# Patient Record
Sex: Male | Born: 1960 | Race: Black or African American | Hispanic: No | Marital: Single | Smoking: Current every day smoker
Health system: Southern US, Community
[De-identification: ages and names within clinical notes are randomized; demographics above are authoritative.]

## PROBLEM LIST (undated history)

## (undated) DIAGNOSIS — I10 Essential (primary) hypertension: Secondary | ICD-10-CM

## (undated) DIAGNOSIS — J449 Chronic obstructive pulmonary disease, unspecified: Secondary | ICD-10-CM

## (undated) DIAGNOSIS — C3490 Malignant neoplasm of unspecified part of unspecified bronchus or lung: Secondary | ICD-10-CM

## (undated) DIAGNOSIS — J45909 Unspecified asthma, uncomplicated: Secondary | ICD-10-CM

## (undated) HISTORY — DX: Malignant neoplasm of unspecified part of unspecified bronchus or lung: C34.90

## (undated) HISTORY — DX: Chronic obstructive pulmonary disease, unspecified: J44.9

## (undated) HISTORY — DX: Essential (primary) hypertension: I10

---

## 2002-12-19 ENCOUNTER — Other Ambulatory Visit: Payer: Self-pay

## 2003-01-08 ENCOUNTER — Other Ambulatory Visit: Payer: Self-pay

## 2004-01-06 ENCOUNTER — Emergency Department: Payer: Self-pay | Admitting: Emergency Medicine

## 2004-03-07 ENCOUNTER — Emergency Department: Payer: Self-pay | Admitting: Emergency Medicine

## 2007-12-12 ENCOUNTER — Emergency Department: Payer: Self-pay | Admitting: Emergency Medicine

## 2007-12-21 ENCOUNTER — Emergency Department: Payer: Self-pay | Admitting: Emergency Medicine

## 2008-01-10 ENCOUNTER — Emergency Department: Payer: Self-pay | Admitting: Internal Medicine

## 2008-12-15 ENCOUNTER — Inpatient Hospital Stay: Payer: Self-pay | Admitting: Internal Medicine

## 2008-12-31 ENCOUNTER — Inpatient Hospital Stay: Payer: Self-pay | Admitting: Internal Medicine

## 2009-02-28 ENCOUNTER — Inpatient Hospital Stay: Payer: Self-pay | Admitting: Internal Medicine

## 2010-01-12 ENCOUNTER — Inpatient Hospital Stay: Payer: Self-pay | Admitting: Internal Medicine

## 2010-03-01 ENCOUNTER — Inpatient Hospital Stay: Payer: Self-pay | Admitting: Internal Medicine

## 2010-06-24 IMAGING — CR DG CHEST 2V
1 series · 2 of 2 positions shown · non-contrast
Comparison: none

REASON FOR EXAM: dyspnea/wheezing/Rales
COMMENTS:   May transport without cardiac monitor

[Series 1: view not recorded · 0.17mm/px · 2 of 2 slices shown]
[im 1/2]
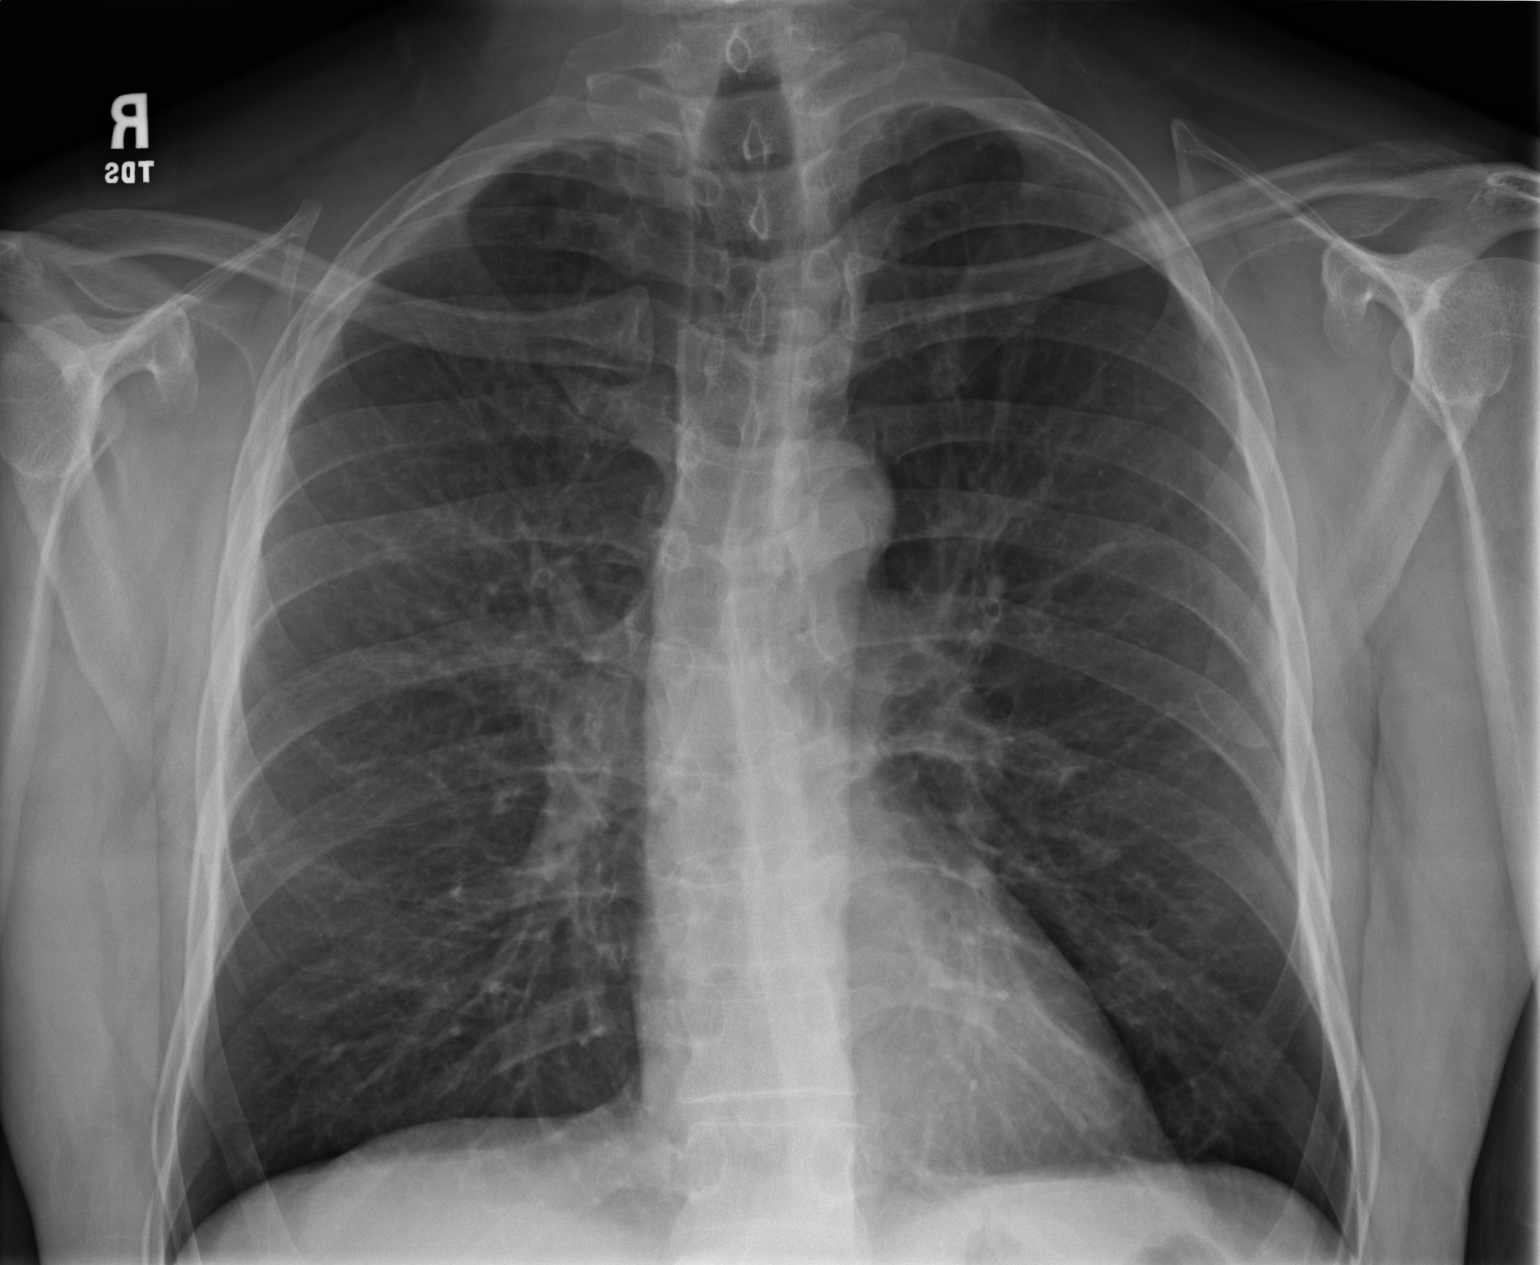
[im 2/2]
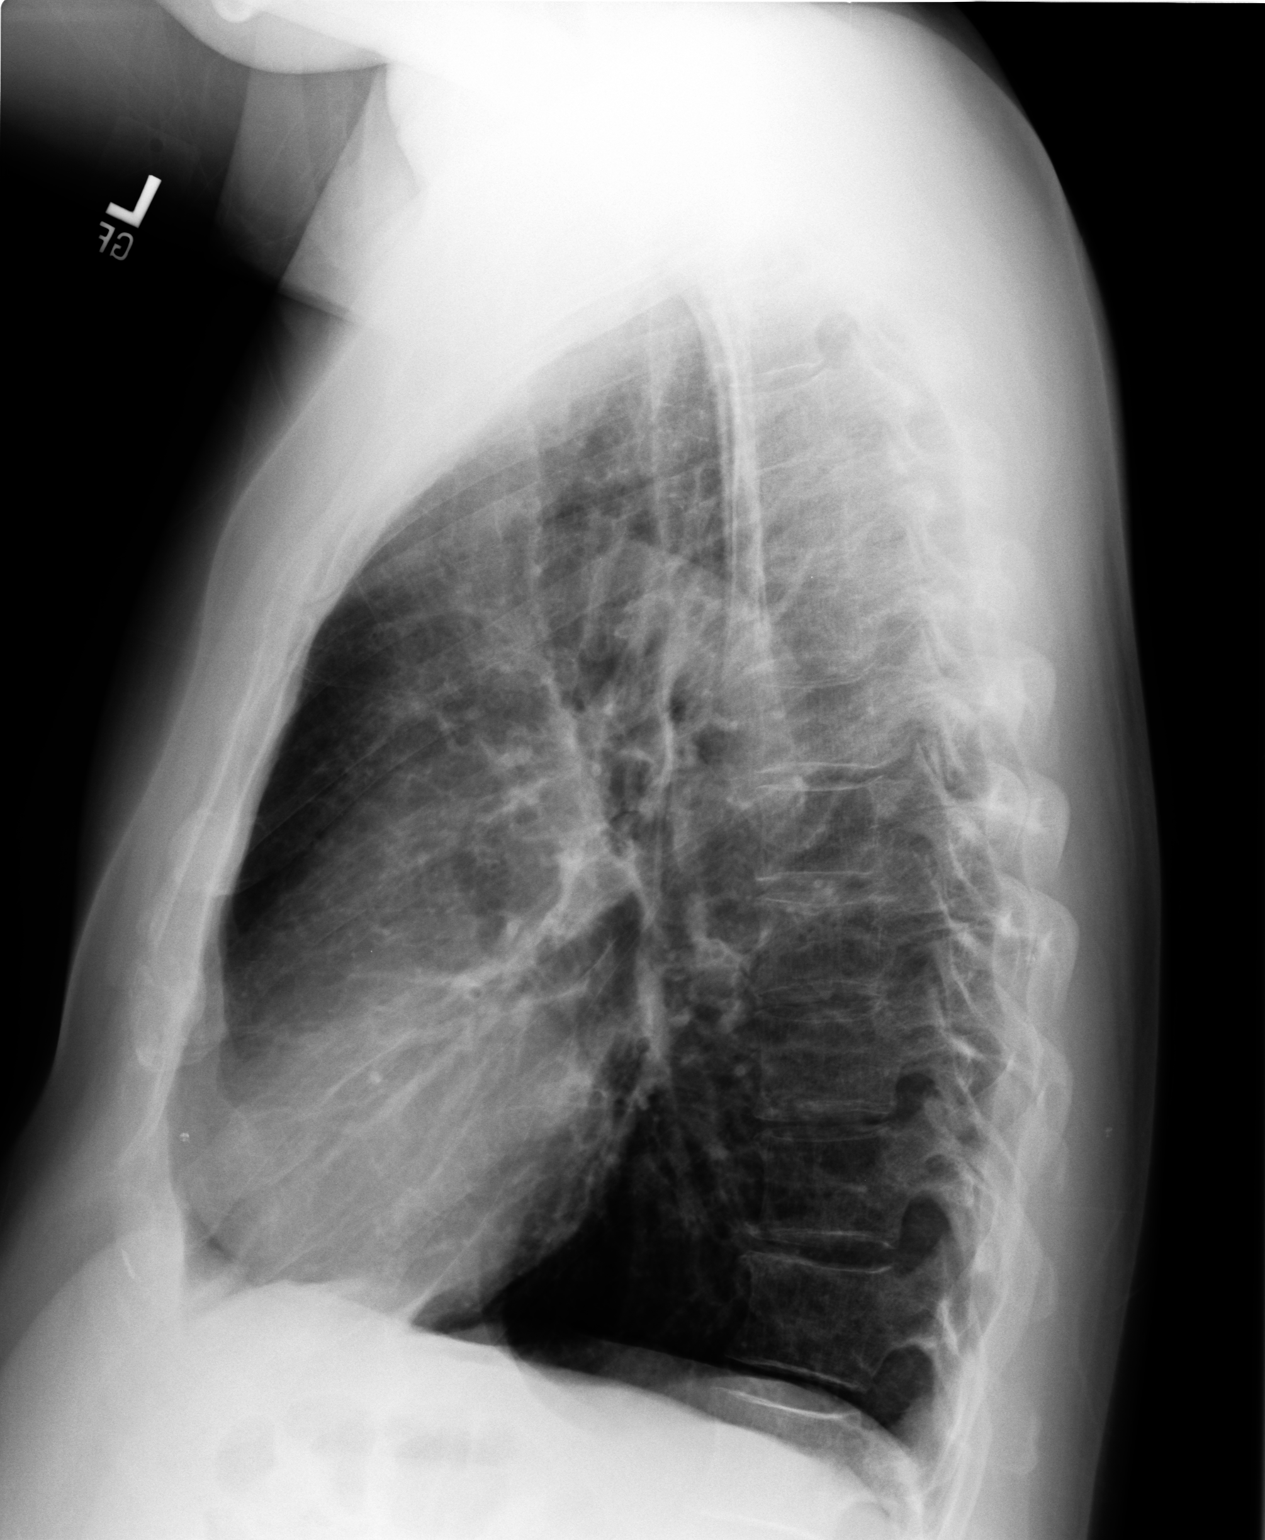

[2 of 2 positions shown; findings below may reference images not displayed]

PROCEDURE:     DXR - DXR CHEST PA (OR AP) AND LATERAL  - December 30, 2008  [DATE]

RESULT:     Comparison is made to previous examination of 12/14/2008.

The lungs are hyperinflated with COPD changes present. There appears to be
some fibrosis. There is no infiltrate, edema, effusion or pneumothorax.
IMPRESSION: COPD.

## 2011-12-28 ENCOUNTER — Emergency Department: Payer: Self-pay | Admitting: Emergency Medicine

## 2011-12-28 LAB — CBC
HCT: 47.9 % (ref 40.0–52.0)
HGB: 16.7 g/dL (ref 13.0–18.0)
MCH: 31.8 pg (ref 26.0–34.0)
MCHC: 34.8 g/dL (ref 32.0–36.0)
MCV: 91 fL (ref 80–100)
Platelet: 216 10*3/uL (ref 150–440)
RBC: 5.25 10*6/uL (ref 4.40–5.90)
RDW: 13 % (ref 11.5–14.5)
WBC: 8.5 10*3/uL (ref 3.8–10.6)

## 2011-12-28 LAB — COMPREHENSIVE METABOLIC PANEL
Albumin: 4.2 g/dL (ref 3.4–5.0)
Alkaline Phosphatase: 111 U/L (ref 50–136)
Anion Gap: 6 — ABNORMAL LOW (ref 7–16)
BUN: 10 mg/dL (ref 7–18)
Bilirubin,Total: 0.6 mg/dL (ref 0.2–1.0)
Chloride: 104 mmol/L (ref 98–107)
Creatinine: 0.81 mg/dL (ref 0.60–1.30)
EGFR (African American): >60
Osmolality: 277 (ref 275–301)
Potassium: 3.7 mmol/L (ref 3.5–5.1)
SGOT(AST): 37 U/L (ref 15–37)
Total Protein: 8.8 g/dL — ABNORMAL HIGH (ref 6.4–8.2)

## 2011-12-30 ENCOUNTER — Emergency Department: Payer: Self-pay | Admitting: Emergency Medicine

## 2012-01-02 LAB — WOUND AEROBIC CULTURE

## 2012-01-02 LAB — CULTURE, BLOOD (SINGLE)

## 2013-11-26 ENCOUNTER — Ambulatory Visit: Payer: Self-pay | Admitting: Oncology

## 2013-12-19 ENCOUNTER — Inpatient Hospital Stay: Payer: Self-pay | Admitting: Internal Medicine

## 2013-12-19 LAB — CBC WITH DIFFERENTIAL/PLATELET
BASOS ABS: 0 10*3/uL (ref 0.0–0.1)
BASOS PCT: 0.5 %
Eosinophil #: 0 10*3/uL (ref 0.0–0.7)
Eosinophil %: 0.1 %
HCT: 43.1 % (ref 40.0–52.0)
HGB: 14.5 g/dL (ref 13.0–18.0)
Lymphocyte #: 1 10*3/uL (ref 1.0–3.6)
Lymphocyte %: 10.5 %
MCH: 29.6 pg (ref 26.0–34.0)
MCHC: 33.6 g/dL (ref 32.0–36.0)
MCV: 88 fL (ref 80–100)
Monocyte #: 1 x10 3/mm (ref 0.2–1.0)
Monocyte %: 9.9 %
NEUTROS PCT: 79 %
Neutrophil #: 7.9 10*3/uL — ABNORMAL HIGH (ref 1.4–6.5)
Platelet: 339 10*3/uL (ref 150–440)
RBC: 4.9 10*6/uL (ref 4.40–5.90)
RDW: 13.9 % (ref 11.5–14.5)
WBC: 10 10*3/uL (ref 3.8–10.6)

## 2013-12-19 LAB — URINALYSIS, COMPLETE
Bilirubin,UR: NEGATIVE
Blood: NEGATIVE
Glucose,UR: NEGATIVE mg/dL (ref 0–75)
Hyaline Cast: 2
LEUKOCYTE ESTERASE: NEGATIVE
Nitrite: NEGATIVE
Ph: 5 (ref 4.5–8.0)
Protein: 30
RBC,UR: 2 /HPF (ref 0–5)
Specific Gravity: 1.023 (ref 1.003–1.030)
Squamous Epithelial: <1

## 2013-12-19 LAB — COMPREHENSIVE METABOLIC PANEL
ALK PHOS: 412 U/L — AB
ANION GAP: 13 (ref 7–16)
Albumin: 2.9 g/dL — ABNORMAL LOW (ref 3.4–5.0)
BUN: 15 mg/dL (ref 7–18)
Bilirubin,Total: 1.1 mg/dL — ABNORMAL HIGH (ref 0.2–1.0)
Calcium, Total: 9 mg/dL (ref 8.5–10.1)
Chloride: 102 mmol/L (ref 98–107)
Co2: 23 mmol/L (ref 21–32)
Creatinine: 0.78 mg/dL (ref 0.60–1.30)
EGFR (African American): >60
Glucose: 86 mg/dL (ref 65–99)
OSMOLALITY: 276 (ref 275–301)
POTASSIUM: 3.7 mmol/L (ref 3.5–5.1)
SGOT(AST): 168 U/L — ABNORMAL HIGH (ref 15–37)
SGPT (ALT): 52 U/L
Sodium: 138 mmol/L (ref 136–145)
Total Protein: 8 g/dL (ref 6.4–8.2)

## 2013-12-19 LAB — ETHANOL: Ethanol: <3 mg/dL

## 2013-12-19 LAB — DRUG SCREEN, URINE
Amphetamines, Ur Screen: NEGATIVE (ref ?–1000)
BENZODIAZEPINE, UR SCRN: NEGATIVE (ref ?–200)
Barbiturates, Ur Screen: NEGATIVE (ref ?–200)
COCAINE METABOLITE, UR ~~LOC~~: POSITIVE (ref ?–300)
Cannabinoid 50 Ng, Ur ~~LOC~~: POSITIVE (ref ?–50)
MDMA (Ecstasy)Ur Screen: NEGATIVE (ref ?–500)
Methadone, Ur Screen: NEGATIVE (ref ?–300)
Opiate, Ur Screen: POSITIVE (ref ?–300)
Phencyclidine (PCP) Ur S: NEGATIVE (ref ?–25)
Tricyclic, Ur Screen: NEGATIVE (ref ?–1000)

## 2013-12-19 LAB — LIPASE, BLOOD: LIPASE: 50 U/L — AB (ref 73–393)

## 2013-12-19 LAB — TROPONIN I

## 2013-12-20 LAB — CBC WITH DIFFERENTIAL/PLATELET
BASOS ABS: 0.1 10*3/uL (ref 0.0–0.1)
BASOS PCT: 0.8 %
EOS ABS: 0 10*3/uL (ref 0.0–0.7)
EOS PCT: 0.6 %
HCT: 37.2 % — AB (ref 40.0–52.0)
HGB: 12.3 g/dL — ABNORMAL LOW (ref 13.0–18.0)
LYMPHS PCT: 14.8 %
Lymphocyte #: 1.2 10*3/uL (ref 1.0–3.6)
MCH: 29.3 pg (ref 26.0–34.0)
MCHC: 33 g/dL (ref 32.0–36.0)
MCV: 89 fL (ref 80–100)
MONOS PCT: 12.5 %
Monocyte #: 1 x10 3/mm (ref 0.2–1.0)
NEUTROS PCT: 71.3 %
Neutrophil #: 5.8 10*3/uL (ref 1.4–6.5)
PLATELETS: 294 10*3/uL (ref 150–440)
RBC: 4.19 10*6/uL — ABNORMAL LOW (ref 4.40–5.90)
RDW: 14.1 % (ref 11.5–14.5)
WBC: 8.2 10*3/uL (ref 3.8–10.6)

## 2013-12-20 LAB — TROPONIN I
Troponin-I: <0.02 ng/mL
Troponin-I: <0.02 ng/mL

## 2013-12-20 LAB — BASIC METABOLIC PANEL
ANION GAP: 9 (ref 7–16)
BUN: 15 mg/dL (ref 7–18)
CO2: 25 mmol/L (ref 21–32)
Calcium, Total: 8.1 mg/dL — ABNORMAL LOW (ref 8.5–10.1)
Chloride: 104 mmol/L (ref 98–107)
Creatinine: 0.85 mg/dL (ref 0.60–1.30)
EGFR (African American): >60
GLUCOSE: 86 mg/dL (ref 65–99)
Osmolality: 276 (ref 275–301)
Potassium: 3.7 mmol/L (ref 3.5–5.1)
SODIUM: 138 mmol/L (ref 136–145)

## 2013-12-22 LAB — PROTIME-INR
INR: 1.1
Prothrombin Time: 14.2 secs (ref 11.5–14.7)

## 2014-01-01 ENCOUNTER — Ambulatory Visit: Payer: Self-pay | Admitting: Oncology

## 2014-01-03 ENCOUNTER — Ambulatory Visit: Payer: Self-pay | Admitting: Oncology

## 2014-01-04 ENCOUNTER — Other Ambulatory Visit: Payer: Self-pay | Admitting: Oncology

## 2014-01-04 LAB — CBC CANCER CENTER
Basophil #: 0 x10 3/mm (ref 0.0–0.1)
Basophil %: 0.2 %
Eosinophil #: 0 x10 3/mm (ref 0.0–0.7)
Eosinophil %: 0.3 %
HCT: 36 % — ABNORMAL LOW (ref 40.0–52.0)
HGB: 11.9 g/dL — AB (ref 13.0–18.0)
LYMPHS PCT: 11.1 %
Lymphocyte #: 1.1 x10 3/mm (ref 1.0–3.6)
MCH: 28.2 pg (ref 26.0–34.0)
MCHC: 33.1 g/dL (ref 32.0–36.0)
MCV: 85 fL (ref 80–100)
MONO ABS: 1.2 x10 3/mm — AB (ref 0.2–1.0)
Monocyte %: 12.4 %
NEUTROS ABS: 7.4 x10 3/mm — AB (ref 1.4–6.5)
NEUTROS PCT: 76 %
PLATELETS: 428 x10 3/mm (ref 150–440)
RBC: 4.23 10*6/uL — ABNORMAL LOW (ref 4.40–5.90)
RDW: 14.4 % (ref 11.5–14.5)
WBC: 9.7 x10 3/mm (ref 3.8–10.6)

## 2014-01-04 LAB — COMPREHENSIVE METABOLIC PANEL
Albumin: 2.5 g/dL — ABNORMAL LOW (ref 3.4–5.0)
Alkaline Phosphatase: 504 U/L — ABNORMAL HIGH
Anion Gap: 13 (ref 7–16)
BUN: 12 mg/dL (ref 7–18)
Bilirubin,Total: 1.2 mg/dL — ABNORMAL HIGH (ref 0.2–1.0)
CHLORIDE: 97 mmol/L — AB (ref 98–107)
CO2: 24 mmol/L (ref 21–32)
Calcium, Total: 8.9 mg/dL (ref 8.5–10.1)
Creatinine: 0.86 mg/dL (ref 0.60–1.30)
GLUCOSE: 89 mg/dL (ref 65–99)
OSMOLALITY: 267 (ref 275–301)
Potassium: 3.9 mmol/L (ref 3.5–5.1)
SGOT(AST): 259 U/L — ABNORMAL HIGH (ref 15–37)
SGPT (ALT): 68 U/L — ABNORMAL HIGH
Sodium: 134 mmol/L — ABNORMAL LOW (ref 136–145)
TOTAL PROTEIN: 7.5 g/dL (ref 6.4–8.2)

## 2014-01-04 LAB — PROTIME-INR
INR: 1.2
Prothrombin Time: 14.6 secs (ref 11.5–14.7)

## 2014-01-04 LAB — APTT: Activated PTT: 35.4 secs (ref 23.6–35.9)

## 2014-01-08 ENCOUNTER — Ambulatory Visit: Payer: Self-pay

## 2014-01-08 LAB — DRUG SCREEN, URINE
Amphetamines, Ur Screen: NEGATIVE (ref ?–1000)
BENZODIAZEPINE, UR SCRN: NEGATIVE (ref ?–200)
Barbiturates, Ur Screen: NEGATIVE (ref ?–200)
CANNABINOID 50 NG, UR ~~LOC~~: POSITIVE (ref ?–50)
COCAINE METABOLITE, UR ~~LOC~~: NEGATIVE (ref ?–300)
MDMA (Ecstasy)Ur Screen: NEGATIVE (ref ?–500)
METHADONE, UR SCREEN: NEGATIVE (ref ?–300)
Opiate, Ur Screen: POSITIVE (ref ?–300)
PHENCYCLIDINE (PCP) UR S: NEGATIVE (ref ?–25)
TRICYCLIC, UR SCREEN: NEGATIVE (ref ?–1000)

## 2014-01-08 LAB — URINALYSIS, COMPLETE
BLOOD: NEGATIVE
Bacteria: NONE SEEN
Bilirubin,UR: NEGATIVE
Glucose,UR: NEGATIVE mg/dL (ref 0–75)
KETONE: NEGATIVE
Leukocyte Esterase: NEGATIVE
Nitrite: NEGATIVE
Ph: 5 (ref 4.5–8.0)
Protein: NEGATIVE
RBC,UR: 1 /HPF (ref 0–5)
Specific Gravity: 1.017 (ref 1.003–1.030)
WBC UR: 4 /HPF (ref 0–5)

## 2014-01-09 LAB — COMPREHENSIVE METABOLIC PANEL
ANION GAP: 10 (ref 7–16)
AST: 516 U/L — AB (ref 15–37)
Albumin: 2.2 g/dL — ABNORMAL LOW (ref 3.4–5.0)
Alkaline Phosphatase: 529 U/L — ABNORMAL HIGH
BUN: 20 mg/dL — ABNORMAL HIGH (ref 7–18)
Bilirubin,Total: 1.3 mg/dL — ABNORMAL HIGH (ref 0.2–1.0)
CHLORIDE: 98 mmol/L (ref 98–107)
CREATININE: 1.35 mg/dL — AB (ref 0.60–1.30)
Calcium, Total: 8.9 mg/dL (ref 8.5–10.1)
Co2: 27 mmol/L (ref 21–32)
EGFR (African American): >60
EGFR (Non-African Amer.): 59 — ABNORMAL LOW
GLUCOSE: 126 mg/dL — AB (ref 65–99)
Osmolality: 274 (ref 275–301)
Potassium: 4.2 mmol/L (ref 3.5–5.1)
SGPT (ALT): 105 U/L — ABNORMAL HIGH
Sodium: 135 mmol/L — ABNORMAL LOW (ref 136–145)
TOTAL PROTEIN: 7 g/dL (ref 6.4–8.2)

## 2014-01-09 LAB — CBC CANCER CENTER
BASOS ABS: 0.1 x10 3/mm (ref 0.0–0.1)
BASOS PCT: 0.5 %
EOS ABS: 0 x10 3/mm (ref 0.0–0.7)
Eosinophil %: 0.2 %
HCT: 33.1 % — AB (ref 40.0–52.0)
HGB: 11 g/dL — ABNORMAL LOW (ref 13.0–18.0)
Lymphocyte #: 0.8 x10 3/mm — ABNORMAL LOW (ref 1.0–3.6)
Lymphocyte %: 6.9 %
MCH: 28.6 pg (ref 26.0–34.0)
MCHC: 33.2 g/dL (ref 32.0–36.0)
MCV: 86 fL (ref 80–100)
MONO ABS: 1.1 x10 3/mm — AB (ref 0.2–1.0)
MONOS PCT: 10.4 %
NEUTROS PCT: 82 %
Neutrophil #: 9 x10 3/mm — ABNORMAL HIGH (ref 1.4–6.5)
PLATELETS: 357 x10 3/mm (ref 150–440)
RBC: 3.84 10*6/uL — AB (ref 4.40–5.90)
RDW: 14.4 % (ref 11.5–14.5)
WBC: 10.9 x10 3/mm — ABNORMAL HIGH (ref 3.8–10.6)

## 2014-01-16 LAB — COMPREHENSIVE METABOLIC PANEL
ALT: 69 U/L — AB
Albumin: 1.8 g/dL — ABNORMAL LOW (ref 3.4–5.0)
Alkaline Phosphatase: 530 U/L — ABNORMAL HIGH
Anion Gap: 12 (ref 7–16)
BUN: 21 mg/dL — AB (ref 7–18)
Bilirubin,Total: 1.2 mg/dL — ABNORMAL HIGH (ref 0.2–1.0)
CHLORIDE: 97 mmol/L — AB (ref 98–107)
CO2: 24 mmol/L (ref 21–32)
Calcium, Total: 8.4 mg/dL — ABNORMAL LOW (ref 8.5–10.1)
Creatinine: 0.99 mg/dL (ref 0.60–1.30)
GLUCOSE: 101 mg/dL — AB (ref 65–99)
Osmolality: 269 (ref 275–301)
Potassium: 3.5 mmol/L (ref 3.5–5.1)
SGOT(AST): 287 U/L — ABNORMAL HIGH (ref 15–37)
Sodium: 133 mmol/L — ABNORMAL LOW (ref 136–145)
TOTAL PROTEIN: 6.6 g/dL (ref 6.4–8.2)

## 2014-01-16 LAB — CBC CANCER CENTER
BASOS PCT: 1.4 %
Basophil #: 0 x10 3/mm (ref 0.0–0.1)
EOS PCT: 2.2 %
Eosinophil #: 0 x10 3/mm (ref 0.0–0.7)
HCT: 29.1 % — AB (ref 40.0–52.0)
HGB: 9.7 g/dL — ABNORMAL LOW (ref 13.0–18.0)
Lymphocyte #: 0.7 x10 3/mm — ABNORMAL LOW (ref 1.0–3.6)
Lymphocyte %: 29.7 %
MCH: 28 pg (ref 26.0–34.0)
MCHC: 33.3 g/dL (ref 32.0–36.0)
MCV: 84 fL (ref 80–100)
Monocyte #: 0.9 x10 3/mm (ref 0.2–1.0)
Monocyte %: 41.2 %
NEUTROS PCT: 25.5 %
Neutrophil #: 0.6 x10 3/mm — ABNORMAL LOW (ref 1.4–6.5)
PLATELETS: 162 x10 3/mm (ref 150–440)
RBC: 3.46 10*6/uL — AB (ref 4.40–5.90)
RDW: 14.6 % — ABNORMAL HIGH (ref 11.5–14.5)
WBC: 2.2 x10 3/mm — ABNORMAL LOW (ref 3.8–10.6)

## 2014-01-29 ENCOUNTER — Ambulatory Visit: Payer: Self-pay | Admitting: Oncology

## 2014-01-30 LAB — CBC CANCER CENTER
BASOS ABS: 0.1 x10 3/mm (ref 0.0–0.1)
BASOS PCT: 1 %
Eosinophil #: 0 x10 3/mm (ref 0.0–0.7)
Eosinophil %: 0.2 %
HCT: 30 % — ABNORMAL LOW (ref 40.0–52.0)
HGB: 10.2 g/dL — ABNORMAL LOW (ref 13.0–18.0)
LYMPHS PCT: 21.5 %
Lymphocyte #: 1.8 x10 3/mm (ref 1.0–3.6)
MCH: 28 pg (ref 26.0–34.0)
MCHC: 34 g/dL (ref 32.0–36.0)
MCV: 82 fL (ref 80–100)
Monocyte #: 1.4 x10 3/mm — ABNORMAL HIGH (ref 0.2–1.0)
Monocyte %: 16.6 %
NEUTROS ABS: 5.1 x10 3/mm (ref 1.4–6.5)
Neutrophil %: 60.7 %
PLATELETS: 354 x10 3/mm (ref 150–440)
RBC: 3.64 10*6/uL — ABNORMAL LOW (ref 4.40–5.90)
RDW: 15.6 % — AB (ref 11.5–14.5)
WBC: 8.5 x10 3/mm (ref 3.8–10.6)

## 2014-01-30 LAB — COMPREHENSIVE METABOLIC PANEL
ALBUMIN: 1.9 g/dL — AB (ref 3.4–5.0)
Alkaline Phosphatase: 391 U/L — ABNORMAL HIGH
Anion Gap: 6 — ABNORMAL LOW (ref 7–16)
BILIRUBIN TOTAL: 0.5 mg/dL (ref 0.2–1.0)
BUN: 7 mg/dL (ref 7–18)
CREATININE: 0.7 mg/dL (ref 0.60–1.30)
Calcium, Total: 8.2 mg/dL — ABNORMAL LOW (ref 8.5–10.1)
Chloride: 99 mmol/L (ref 98–107)
Co2: 28 mmol/L (ref 21–32)
EGFR (African American): >60
EGFR (Non-African Amer.): >60
GLUCOSE: 88 mg/dL (ref 65–99)
OSMOLALITY: 264 (ref 275–301)
Potassium: 3.9 mmol/L (ref 3.5–5.1)
SGOT(AST): 54 U/L — ABNORMAL HIGH (ref 15–37)
SGPT (ALT): 25 U/L
SODIUM: 133 mmol/L — AB (ref 136–145)
TOTAL PROTEIN: 7.1 g/dL (ref 6.4–8.2)

## 2014-02-20 LAB — CBC CANCER CENTER
BASOS ABS: 0.1 x10 3/mm (ref 0.0–0.1)
Basophil %: 1.3 %
EOS PCT: 0.9 %
Eosinophil #: 0 x10 3/mm (ref 0.0–0.7)
HCT: 33 % — ABNORMAL LOW (ref 40.0–52.0)
HGB: 11.1 g/dL — AB (ref 13.0–18.0)
LYMPHS ABS: 1.7 x10 3/mm (ref 1.0–3.6)
Lymphocyte %: 36.3 %
MCH: 29.4 pg (ref 26.0–34.0)
MCHC: 33.7 g/dL (ref 32.0–36.0)
MCV: 87 fL (ref 80–100)
Monocyte #: 0.8 x10 3/mm (ref 0.2–1.0)
Monocyte %: 17 %
NEUTROS ABS: 2.1 x10 3/mm (ref 1.4–6.5)
Neutrophil %: 44.5 %
Platelet: 168 x10 3/mm (ref 150–440)
RBC: 3.79 10*6/uL — ABNORMAL LOW (ref 4.40–5.90)
RDW: 21 % — ABNORMAL HIGH (ref 11.5–14.5)
WBC: 4.7 x10 3/mm (ref 3.8–10.6)

## 2014-02-20 LAB — COMPREHENSIVE METABOLIC PANEL
ALBUMIN: 2.9 g/dL — AB (ref 3.4–5.0)
ALK PHOS: 253 U/L — AB (ref 46–116)
ALT: 35 U/L (ref 14–63)
Anion Gap: 6 — ABNORMAL LOW (ref 7–16)
BILIRUBIN TOTAL: 0.4 mg/dL (ref 0.2–1.0)
BUN: 15 mg/dL (ref 7–18)
CREATININE: 0.86 mg/dL (ref 0.60–1.30)
Calcium, Total: 9 mg/dL (ref 8.5–10.1)
Chloride: 101 mmol/L (ref 98–107)
Co2: 30 mmol/L (ref 21–32)
EGFR (African American): >60
EGFR (Non-African Amer.): >60
GLUCOSE: 104 mg/dL — AB (ref 65–99)
OSMOLALITY: 275 (ref 275–301)
Potassium: 3.7 mmol/L (ref 3.5–5.1)
SGOT(AST): 44 U/L — ABNORMAL HIGH (ref 15–37)
SODIUM: 137 mmol/L (ref 136–145)
TOTAL PROTEIN: 7.8 g/dL (ref 6.4–8.2)

## 2014-02-26 ENCOUNTER — Ambulatory Visit: Payer: Self-pay | Admitting: Oncology

## 2014-03-27 ENCOUNTER — Ambulatory Visit
Admit: 2014-03-27 | Disposition: A | Discharge status: Home / Self Care | Payer: Self-pay | Attending: Oncology | Admitting: Oncology

## 2014-04-24 LAB — CBC CANCER CENTER
Basophil #: 0 x10 3/mm (ref 0.0–0.1)
Basophil %: 0.7 %
EOS PCT: 0.6 %
Eosinophil #: 0 x10 3/mm (ref 0.0–0.7)
HCT: 29.7 % — AB (ref 40.0–52.0)
HGB: 10.1 g/dL — ABNORMAL LOW (ref 13.0–18.0)
LYMPHS ABS: 1.4 x10 3/mm (ref 1.0–3.6)
LYMPHS PCT: 34.8 %
MCH: 31.8 pg (ref 26.0–34.0)
MCHC: 33.9 g/dL (ref 32.0–36.0)
MCV: 94 fL (ref 80–100)
Monocyte #: 0.6 x10 3/mm (ref 0.2–1.0)
Monocyte %: 15.5 %
Neutrophil #: 2 x10 3/mm (ref 1.4–6.5)
Neutrophil %: 48.4 %
Platelet: 123 x10 3/mm — ABNORMAL LOW (ref 150–440)
RBC: 3.17 10*6/uL — ABNORMAL LOW (ref 4.40–5.90)
RDW: 17.3 % — ABNORMAL HIGH (ref 11.5–14.5)
WBC: 4.2 x10 3/mm (ref 3.8–10.6)

## 2014-04-24 LAB — COMPREHENSIVE METABOLIC PANEL
ALK PHOS: 118 U/L
ANION GAP: 2 — AB (ref 7–16)
Albumin: 3.8 g/dL
BUN: 17 mg/dL
Bilirubin,Total: 0.5 mg/dL
CALCIUM: 9.2 mg/dL
CHLORIDE: 104 mmol/L
Co2: 27 mmol/L
Creatinine: 0.66 mg/dL
EGFR (African American): >60
EGFR (Non-African Amer.): >60
GLUCOSE: 96 mg/dL
POTASSIUM: 3.7 mmol/L
SGOT(AST): 33 U/L
SGPT (ALT): 24 U/L
SODIUM: 133 mmol/L — AB
Total Protein: 7.3 g/dL

## 2014-04-24 LAB — CREATININE, SERUM: CREATINE, SERUM: 0.66

## 2014-04-27 ENCOUNTER — Ambulatory Visit
Admit: 2014-04-27 | Disposition: A | Discharge status: Home / Self Care | Payer: Self-pay | Attending: Oncology | Admitting: Oncology

## 2014-05-19 NOTE — Discharge Summary (Signed)
PATIENT NAME:  Mark Lynn, Mark Lynn MR#:  [id number] DATE OF BIRTH:  1960-08-10  DATE OF ADMISSION:  12/19/2013 DATE OF DISCHARGE:  12/23/2013  ADMITTING DIAGNOSIS: Right upper quadrant pain, right-sided chest pain.   DISCHARGE DIAGNOSES:  1.  Right-sided chest pain and abdominal pain due to right lower lobe lung mass, likely bronchogenic cancer, status post biopsy.  2.  Hypertensive urgency, likely due to cocaine abuse.  3.  Transaminitis, likely secondary to possible metastatic disease.  4.  Nicotine abuse.  5.  Substance abuse.   CONSULTANTS: Kathlene November. Grayland Ormond, MD; Dr. Humphrey Rolls.   PERTINENT LABORATORIES AND EVALUATIONS: Admitting glucose 86, BUN 15, creatinine 0.78, sodium 138, potassium 3.7, chloride 102, CO2 of 23. LFTs: Total protein 8.0, albumin 2.9, bilirubin total 1.1, alkaline phosphatase 412, AST 168, ALT 52. Troponin less than 0.02 x 3. Urine toxicology screen positive for cocaine, THC, opioids. WBC 10, hemoglobin 14.5, platelet count 339,000.  CT of the chest and abdomen shows large right lower lobe lung mass concerning for primary bronchogenic carcinoma, evidence of endobronchial culminate of 2 more involving the right lower lobe. Also lesions within the liver, possible metastatic disease.   HOSPITAL COURSE: The patient is a 54 year old African American male with history of COPD, hypertension, substance abuse and medical noncompliance who presents with right upper quadrant pain and right-sided chest pain. The patient was seen in the ED and was noted to have accelerated hypertension and had a CT scan of the chest and abdomen which showed a large right lower lobe lung mass. The patient was admitted for further evaluation and treatment. His blood pressure continued to be elevated, and he required IV medications with improvement in the blood pressure. The patient was also seen by pulmonary and oncology. He underwent a biopsy of the lung mass, which is likely bronchogenic cancer. The patient is  currently doing better, blood pressure is improved, and had a biopsy. Dr. Raul Del plans to follow him as outpatient next week on Monday for further evaluation and treatment. The patient was strongly recommended to stop smoking and stop using drugs.   MEDICATIONS AT THE TIME OF DISCHARGE: Oxycodone 5 mg q. 4 p.r.n. for pain, clonidine 0.3 one tablet p.o. b.i.d., hydralazine 100 one tablet 4 times a day, nicotine 21 mg patch daily.   DIET: Low sodium.   ACTIVITY: As tolerated.   FOLLOWUP: With Dr. Grayland Ormond on Monday. The patient is told to stop smoking, drinking and doing drugs.   TIME SPENT: Thirty-five minutes.    ____________________________ Lafonda Mosses Posey Pronto, MD shp:TT D: 12/24/2013 12:48:02 ET T: 12/24/2013 20:55:58 ET JOB#: [id number]  cc: Nuri Branca H. Posey Pronto, MD, <Dictator> Alric Seton MD ELECTRONICALLY SIGNED 12/30/2013 8:37

## 2014-05-19 NOTE — H&P (Signed)
PATIENT NAME:  ASAAD, GULLEY MR#:  [id number] DATE OF BIRTH:  1961-01-21  DATE OF ADMISSION:  12/19/2013  REFERRING PHYSICIAN:  Brunilda Payor A. Edd Fabian, MD  PRIMARY CARE PHYSICIAN:  None.   CHIEF COMPLAINT:  Right upper quadrant pain or right chest pain.   HISTORY OF PRESENT ILLNESS: This is a 54 year old [place] gentleman with a history of COPD, hypertension, substance use, and known medical noncompliance, presenting with right upper quadrant pain and right chest pain. He describes intermittent pain of 2 weeks' total duration, gradually worsening, right upper quadrant as well as right lower chest midaxillary line. It is a shooting pain in quality, 5 to 6 out of 10 in intensity, nonradiating, and pleuritic in nature. No relieving factors. He has some associated dyspnea on exertion. No other symptoms. Despite this, he was found to be markedly hypertensive en route to the Emergency Department and found to be cocaine positive. He has received some doses of hydralazine as well as clonidine. His blood pressure is now better controlled. His pain is better controlled as well.   REVIEW OF SYSTEMS: CONSTITUTIONAL:  No fevers or chills. Positive for fatigue and weakness.  EYES:  Denies blurry vision, double vision, or eye pain. EARS, NOSE, AND THROAT:  Denies tinnitus, ear pain, or hearing loss. RESPIRATORY:  Positive for dyspnea on exertion as well as cough, nonproductive. Denies wheezing.  CARDIOVASCULAR:  Positive for chest pain as described above. Denies any orthopnea or edema.  GASTROINTESTINAL:  Denies nausea, vomiting, diarrhea, or abdominal pain. GENITOURINARY:  Denies dysuria or hematuria. ENDOCRINE:  Denies nocturia or thyroid problems. HEMATOLOGIC:  Denies easy bruising or bleeding. SKIN:  No rash or lesions. MUSCULOSKELETAL:  Denies any pain in the neck, back, shoulders, knees, or hips or arthritic symptoms. NEUROLOGIC:  Denies paralysis or paresthesias.  PSYCHIATRIC:  Denies anxiety or  depressive symptoms.   Otherwise, full review of systems performed by me is negative.  PAST MEDICAL HISTORY:  COPD, non-oxygen dependent, essential hypertension, and substance abuse. He, however, is on no medications and follows with no PCP.   SOCIAL HISTORY:  Remote tobacco usage. Denies any alcohol usage. Positive for substance abuse including cocaine and marijuana.   FAMILY HISTORY:  No known cardiovascular or pulmonary disorders.   ALLERGIES:  PENICILLIN.   HOME MEDICATIONS:  None at this time.   PHYSICAL EXAMINATION: VITAL SIGNS:  Temperature 98.5, heart 104, respiratory rate 16, blood pressure 212/144, currently 191/106, and saturating 95% on room air. Weight is 63.5 kg, BMI 18.  GENERAL:  Well-nourished African-American gentleman currently in no acute distress.  HEAD:  Normocephalic, atraumatic.  EYES:  Pupils are equal, round, and reactive to light. Extraocular muscles are intact. No scleral icterus.  MOUTH:  Moist mucosal membranes. Dentition is intact. No abcess noted.  EARS, NOSE, AND THROAT:  Clear with exudates. No external lesions.  NECK:  Supple. No thyromegaly. No nodules. No JVD.  PULMONARY:  Diffuse scant expiratory wheezing with prolonged expiratory phase as well as coarse breath sounds noted throughout all lung fields. No use of accessory muscles. Good respiratory effort.  CHEST:  Nontender to palpation.  CARDIOVASCULAR:  S1 and S2, regular rate and rhythm. No murmurs, rubs, or gallops. No edema. Pedal pulses are 2+ bilaterally.  GASTROINTESTINAL:  Soft, nontender, nondistended. No masses. Positive bowel sounds. No hepatosplenomegaly.  MUSCULOSKELETAL:  No swelling, clubbing, or edema. Range of motion is full in all extremities.  NEUROLOGIC:  Cranial nerves II through XII are intact. No gross focal neurological deficits. Sensation  is intact. Reflexes are intact.  SKIN:  No ulceration, lesions, rash, or cyanosis.  Skin is warm and dry. Turgor is intact.   PSYCHIATRIC:   Affect is blunted. He is somewhat somnolent after receiving Ativan as well as pain medications; however, he is easily arousable and fully conversant at that time. He will follow commands and answer all questions. He is oriented x 3. Insight and judgment appear to be intact.   LABORATORY AND RADIOLOGIC DATA:  EKG performed reveals sinus tachycardia with heart rate of 103 with LVH. No ST-T wave abnormalities. Remainder of laboratory data:  Sodium is 138, potassium 3.7, chloride 102, bicarbonate 23, BUN 15, creatinine 0.78, and glucose 86. LFTs:  Albumin of 2.9, bilirubin 1.1, alkaline phosphatase 412, AST 168, and ALT 52. Urine drug screen is positive for cocaine and cannabinoids as well as opiates. WBC is 10, hemoglobin 14.5, and platelets are 339,000. Urinalysis is negative for evidence of infection. Chest x-ray performed reveals possible mass in the right lower lobe associated with atelectasis. Abdominal ultrasound revealed multiple liver lesions. CT of the chest, abdomen, and pelvis performed reveals large right lower lobe lung mass, concern for primary bronchogenic carcinoma, evidence of bronchial component. The tumor is involving the right lower airway with extensive metastasis to both lobes of the liver.   ASSESSMENT AND PLAN:  This is a 54 year old African-American gentleman with history of COPD, hypertension, and substance abuse, presenting with right chest and right upper quadrant pain.   1.  Hypertensive urgency, likely cocaine abuse. Avoid beta blockers. Hydralazine p.r.n. Goal blood pressure is systolic less than 165. Currently, blood pressure is improved.  2.  Right lower lobe mass, suspect bronchogenic cancer. Provide pain control. Initiate bowel regimen and add DuoNeb treatments q. 4 hours p.r.n. for his wheezing noted on exam. We will also consult pulmonary and critical care for potential endoscopic biopsy performed under ultrasound. We will also consult oncology.  3.  Transaminitis, likely  secondary to metastasis, as seen on CAT scan. 4.  Venous thromboembolism prophylaxis with sequential compression devices.  CODE STATUS:  The patient is a full code.   TIME SPENT:  45 minutes.     ____________________________ Aaron Mose. Hower, MD dkh:nb D: 12/19/2013 53:74:82 ET T: 12/20/2013 01:15:07 ET JOB#: [id number]  cc: Aaron Mose. Hower, MD, <Dictator> DAVID Woodfin Ganja MD ELECTRONICALLY SIGNED 12/27/2013 23:55

## 2014-05-19 NOTE — Op Note (Signed)
PATIENT NAME:  Mark Lynn, Mark Lynn MR#:  [id number] DATE OF BIRTH:  03-01-1960  DATE OF PROCEDURE:  01/08/2014  PREOPERATIVE DIAGNOSIS:  Lung cancer.   POSTOPERATIVE DIAGNOSIS:  Lung cancer.   OPERATION PERFORMED:  Right subclavian Port-A-Cath insertion.   INDICATIONS FOR PROCEDURE: [name] is a 54 year old gentleman who recently presented with right upper quadrant pain and enlarging mass in his right upper quadrant. This turned out to be metastatic lung cancer and he was offered a Port-A-Cath insertion. The indications and risks were explained to the patient who gives informed consent.   SURGEON: Louis Matte, MD   ASSISTANT: None.   DESCRIPTION OF PROCEDURE: The patient was brought to the operating suite and placed in the supine position. Laryngeal mask airway anesthesia was established. The patient was prepped and draped in the usual sterile fashion. Using laryngeal mask airway, the patient had marked respiratory inspiration and expiration. Therefore, the internal jugular vein could not be adequately catheterized. We could get a needle into the vein, but with deep inspiration, the vein would collapse now allowing passage of the wire. We therefore turned our attention to the right subclavian vein, which also was percutaneously catheterized. A wire was inserted on the right side of the heart. An appropriate site was selected on the chest wall and a Port-A-Cath pocket was created. Under fluoroscopic guidance, the catheter was brought up onto the chest wall and it was inserted through a peel-away sheath into the junction of the right atrium and superior vena cava. This was all accomplished under fluoroscopy. The catheter irrigated and flushed nicely. The catheter was then assembled appropriately and secured to the anterior chest wall with interrupted Prolene sutures on 3 of the 4 quadrants. The catheter was irrigated and flushed again and fluoroscopy was again carried out. There was no untoward  effects and the wounds were closed; 3-0 Vicryl and 4-0 Vicryl completed the closure. A single stitch was placed at the subclavian site. The patient was then awakened and taken to the recovery room in stable condition. It should be noted that before the patient had dressings applied, the catheter was accessed, as he will be using this tomorrow.    ____________________________ Lew Dawes Genevive Bi, MD teo:LT D: 01/08/2014 16:36:32 ET T: 01/08/2014 19:56:11 ET JOB#: [id number]  cc: Christia Reading E. Genevive Bi, MD, <Dictator> Louis Matte MD ELECTRONICALLY SIGNED 01/10/2014 14:01

## 2014-05-19 NOTE — Consult Note (Signed)
Note Type Consult   Subjective: Chief Complaint/Diagnosis:   Lung mass with multiple liver lesions highly suspicious for metastatic disease. HPI:   Patient is a 54 year old male with multiple medical problems who presented to the emergency room with right upper quadrant pain as well as right chest pain. He was found to be cocaine positive as well as markedly hypertensive and admitted for hypertensive urgency. Subsequent workup included CT scan which noted a large lung mass as well as multiple liver lesions suspicious for metastatic disease. Currently, patient feels well. He continues to have right upper quadrant pain, but states it has improved since admission. He has no neurologic complaints. He denies any recent fevers. He has a good appetite and denies weight loss. He denies any nausea, vomiting, constipation, or diarrhea. He has no melena or hematochezia. He has no urinary complaints. Patient otherwise feels well and offers no further specific complaints.   Review of Systems:  Performance Status (ECOG): 0  Review of Systems:   As per HPI. Otherwise, 10 point system review was negative.   Allergies:  PCN: Unknown  PFSH: Additional Past Medical and Surgical History: COPD, hypertension, substance abuse.    Family history: Negative and noncontributory.    Social history: Distant tobacco use, unclear how much. Denies alcohol. Urine drug screen positive for cocaine and marijuana.   Vital Signs:  :: vital signs stable, patient afebrile.   Physical Exam:  General: well developed, well nourished, and in no acute distress  Mental Status: normal affect  Eyes: anicteric sclera  Head, Ears, Nose,Throat: Normocephalic, moist mucous membranes, clear oropharynx without erythema or thrush.  Neck, Thyroid: No palpable lymphadenopathy, thyroid midline without nodules.  Respiratory: clear to auscultation bilaterally  Cardiovascular: regular rate and rhythm, no murmur, rub, or gallop   Gastrointestinal: soft, right upper quadrant pain with minimal palpation, normal active bowel sounds  Musculoskeletal: No edema  Skin: No rash or petechiae noted  Neurological: alert, answering all questions appropriately.  Cranial nerves grossly intact   Laboratory Results: Routine Chem:  25-Nov-15 06:19   Glucose, Serum 86  BUN 15  Creatinine (comp) 0.85  Sodium, Serum 138  Potassium, Serum 3.7  Chloride, Serum 104  CO2, Serum 25  Calcium (Total), Serum  8.1  Anion Gap 9  Osmolality (calc) 276  eGFR (African American) >60  eGFR (Non-African American) >60 (eGFR values <75m/min/1.73 m2 may be an indication of chronic kidney disease (CKD). Calculated eGFR, using the MRDR Study equation, is useful in  patients with stable renal function. The eGFR calculation will not be reliable in acutely ill patients when serum creatinine is changing rapidly. It is not useful in patients on dialysis. The eGFR calculation may not be applicable to patients at the low and high extremes of body sizes, pregnant women, and vegetarians.)  Cardiac:  25-Nov-15 06:19   Troponin I < 0.02 (0.00-0.05 0.05 ng/mL or less: NEGATIVE  Repeat testing in 3-6 hrs  if clinically indicated. >0.05 ng/mL: POTENTIAL  MYOCARDIAL INJURY. Repeat  testing in 3-6 hrs if  clinically indicated. NOTE: An increase or decrease  of 30% or more on serial  testing suggests a  clinically important change)  Routine Hem:  25-Nov-15 06:19   WBC (CBC) 8.2  RBC (CBC)  4.19  Hemoglobin (CBC)  12.3  Hematocrit (CBC)  37.2  Platelet Count (CBC) 294  MCV 89  MCH 29.3  MCHC 33.0  RDW 14.1  Neutrophil % 71.3  Lymphocyte % 14.8  Monocyte % 12.5  Eosinophil %  0.6  Basophil % 0.8  Neutrophil # 5.8  Lymphocyte # 1.2  Monocyte # 1.0  Eosinophil # 0.0  Basophil # 0.1 (Result(s) reported on 20 Dec 2013 at 06:46AM.)   Medical Imaging Results:   Review Medical Imaging   PA and Lateral 19-Dec-2013 17:17:00: IMPRESSION:   Possible mass in the medial right lower lobe with associated dense  consolidation peripherally suspicious for post-obstructive  atelectasis or pneumonitis. CT of the chest with contrast is  recommended in further evaluation to confirm or deny this finding.    Electronically Signed    By: Evangeline Dakin M.D.    On: 12/19/2013 17:22         Verified By: Deniece Portela, M.D., Abdomen Limited Survey 19-Dec-2013 18:59:00: IMPRESSION:  Multiple liver lesions cannot be definitively characterized. CT of  the abdomen and pelvis with contrast is recommended for further  evaluation.    Normal gallbladder.      Electronically Signed    By: Inge Rise M.D.    On: 12/19/2013 19:05         Verified By: Ramond Dial, M.D., Chest, Abd, and Pelvis With Contrast 19-Dec-2013 20:21:00: IMPRESSION:  1. Large right lower lobe lung mass is concerning for primary  bronchogenic carcinoma. Assuming non-small cell lung cancer this  would be considered at least a T3N2M1b or Stage 4 tumor.  2. Evidence of ended bronchial component of tumor involving the  right lower lobe airway.      Electronically Signed    By: Kerby Moors M.D.    On: 12/19/2013 21:07         Verified By: Angelita Ingles, M.D.,  Assessment and Plan: Impression:   Lung mass with multiple liver lesions highly suspicious for metastatic disease. Plan:   1. Lung mass with liver lesions: Highly suspicious for metastatic lung cancer. We will order ultrasound-guided biopsy of liver lesion to confirm diagnosis. If patient remains in the hospital, can order PET scan as well as MRI of the brain for initial staging workup. If patient gets discharged, please have him follow-up in the [facility] on Monday, November 30 or Tuesday, December 1 for further evaluation and continuation of workup.Pain: Possibly secondary to metastatic disease. Continue current pain regimen. Treatment may be difficult given his recent cocaine abuse. consult, will  follow.  Electronic Signatures: Delight Hoh (MD)  (Signed [phone] 16:36)  Authored: Note Type, CC/HPI, Review of Systems, ALLERGIES, Patient Family Social History, Vital Signs, Physical Exam, Lab Results Review, Rad Results Review, Assessment and Plan   Last Updated: 25-Nov-15 16:36 by Delight Hoh (MD)

## 2014-05-19 NOTE — Consult Note (Signed)
Patient having biopsy of liver lesion to obtain definitive diagnosis of his presumed metastatic cancer. We will arrange close follow-up in the [facility] to discuss his pathology results and treatment planning. Will continue to follow while patient is in the hospital.  Electronic Signatures: Delight Hoh (MD)  (Signed on 27-Nov-15 10:44)  Authored  Last Updated: 27-Nov-15 10:44 by Delight Hoh (MD)

## 2014-05-21 LAB — SURGICAL PATHOLOGY

## 2014-05-22 LAB — COMPREHENSIVE METABOLIC PANEL
ALT: 21 U/L
Albumin: 3.6 g/dL
Alkaline Phosphatase: 107 U/L
Anion Gap: 6 — ABNORMAL LOW (ref 7–16)
BUN: 16 mg/dL
Bilirubin,Total: 0.5 mg/dL
CALCIUM: 9.5 mg/dL
Chloride: 99 mmol/L — ABNORMAL LOW
Co2: 28 mmol/L
Creatinine: 0.76 mg/dL
EGFR (African American): >60
EGFR (Non-African Amer.): >60
Glucose: 111 mg/dL — ABNORMAL HIGH
Potassium: 3.8 mmol/L
SGOT(AST): 41 U/L
Sodium: 133 mmol/L — ABNORMAL LOW
Total Protein: 7.5 g/dL

## 2014-05-22 LAB — CBC CANCER CENTER
BASOS PCT: 0.3 %
Basophil #: 0 x10 3/mm (ref 0.0–0.1)
EOS ABS: 0 x10 3/mm (ref 0.0–0.7)
EOS PCT: 0.9 %
HCT: 30.3 % — ABNORMAL LOW (ref 40.0–52.0)
HGB: 10.2 g/dL — ABNORMAL LOW (ref 13.0–18.0)
LYMPHS PCT: 25.8 %
Lymphocyte #: 1 x10 3/mm (ref 1.0–3.6)
MCH: 32.2 pg (ref 26.0–34.0)
MCHC: 33.6 g/dL (ref 32.0–36.0)
MCV: 96 fL (ref 80–100)
MONO ABS: 0.5 x10 3/mm (ref 0.2–1.0)
Monocyte %: 12.5 %
NEUTROS PCT: 60.5 %
Neutrophil #: 2.4 x10 3/mm (ref 1.4–6.5)
PLATELETS: 130 x10 3/mm — AB (ref 150–440)
RBC: 3.17 10*6/uL — ABNORMAL LOW (ref 4.40–5.90)
RDW: 14.7 % — ABNORMAL HIGH (ref 11.5–14.5)
WBC: 3.9 x10 3/mm (ref 3.8–10.6)

## 2014-05-22 LAB — CREATININE, SERUM: Creatine, Serum: 0.76

## 2014-06-02 ENCOUNTER — Other Ambulatory Visit: Payer: Self-pay | Admitting: Oncology

## 2014-06-02 DIAGNOSIS — C3490 Malignant neoplasm of unspecified part of unspecified bronchus or lung: Secondary | ICD-10-CM | POA: Insufficient documentation

## 2014-06-02 DIAGNOSIS — C3491 Malignant neoplasm of unspecified part of right bronchus or lung: Secondary | ICD-10-CM

## 2014-06-05 ENCOUNTER — Inpatient Hospital Stay: Payer: Medicaid Other | Admitting: *Deleted

## 2014-06-05 ENCOUNTER — Inpatient Hospital Stay (HOSPITAL_BASED_OUTPATIENT_CLINIC_OR_DEPARTMENT_OTHER): Payer: Medicaid Other | Admitting: Oncology

## 2014-06-05 ENCOUNTER — Inpatient Hospital Stay: Payer: Medicaid Other | Attending: Oncology | Admitting: *Deleted

## 2014-06-05 VITALS — BP 133/85 | HR 73 | Temp 97.7°F | Resp 18 | Wt 158.9 lb

## 2014-06-05 DIAGNOSIS — R634 Abnormal weight loss: Secondary | ICD-10-CM

## 2014-06-05 DIAGNOSIS — C771 Secondary and unspecified malignant neoplasm of intrathoracic lymph nodes: Secondary | ICD-10-CM

## 2014-06-05 DIAGNOSIS — C3431 Malignant neoplasm of lower lobe, right bronchus or lung: Secondary | ICD-10-CM | POA: Diagnosis not present

## 2014-06-05 DIAGNOSIS — R5383 Other fatigue: Secondary | ICD-10-CM | POA: Diagnosis not present

## 2014-06-05 DIAGNOSIS — C787 Secondary malignant neoplasm of liver and intrahepatic bile duct: Secondary | ICD-10-CM | POA: Diagnosis not present

## 2014-06-05 DIAGNOSIS — G893 Neoplasm related pain (acute) (chronic): Secondary | ICD-10-CM | POA: Diagnosis not present

## 2014-06-05 DIAGNOSIS — R52 Pain, unspecified: Secondary | ICD-10-CM

## 2014-06-05 DIAGNOSIS — Z79899 Other long term (current) drug therapy: Secondary | ICD-10-CM

## 2014-06-05 DIAGNOSIS — T451X5A Adverse effect of antineoplastic and immunosuppressive drugs, initial encounter: Secondary | ICD-10-CM | POA: Diagnosis not present

## 2014-06-05 DIAGNOSIS — R110 Nausea: Secondary | ICD-10-CM | POA: Diagnosis not present

## 2014-06-05 DIAGNOSIS — G620 Drug-induced polyneuropathy: Secondary | ICD-10-CM | POA: Diagnosis not present

## 2014-06-05 DIAGNOSIS — I10 Essential (primary) hypertension: Secondary | ICD-10-CM

## 2014-06-05 DIAGNOSIS — F17210 Nicotine dependence, cigarettes, uncomplicated: Secondary | ICD-10-CM

## 2014-06-05 DIAGNOSIS — Z5111 Encounter for antineoplastic chemotherapy: Secondary | ICD-10-CM | POA: Insufficient documentation

## 2014-06-05 DIAGNOSIS — C3491 Malignant neoplasm of unspecified part of right bronchus or lung: Secondary | ICD-10-CM | POA: Diagnosis not present

## 2014-06-05 DIAGNOSIS — R748 Abnormal levels of other serum enzymes: Secondary | ICD-10-CM | POA: Diagnosis not present

## 2014-06-05 DIAGNOSIS — J449 Chronic obstructive pulmonary disease, unspecified: Secondary | ICD-10-CM | POA: Insufficient documentation

## 2014-06-05 DIAGNOSIS — T451X5S Adverse effect of antineoplastic and immunosuppressive drugs, sequela: Secondary | ICD-10-CM

## 2014-06-05 DIAGNOSIS — C801 Malignant (primary) neoplasm, unspecified: Secondary | ICD-10-CM

## 2014-06-05 LAB — CBC WITH DIFFERENTIAL/PLATELET
BASOS ABS: 0 10*3/uL (ref 0–0.1)
BASOS PCT: 0 %
EOS PCT: 1 %
Eosinophils Absolute: 0.1 10*3/uL (ref 0–0.7)
HCT: 30.6 % — ABNORMAL LOW (ref 40.0–52.0)
Hemoglobin: 10.2 g/dL — ABNORMAL LOW (ref 13.0–18.0)
LYMPHS PCT: 26 %
Lymphs Abs: 1.5 10*3/uL (ref 1.0–3.6)
MCH: 31.4 pg (ref 26.0–34.0)
MCHC: 33.3 g/dL (ref 32.0–36.0)
MCV: 94.2 fL (ref 80.0–100.0)
MONO ABS: 0.7 10*3/uL (ref 0.2–1.0)
Monocytes Relative: 13 %
Neutro Abs: 3.5 10*3/uL (ref 1.4–6.5)
Neutrophils Relative %: 60 %
PLATELETS: 238 10*3/uL (ref 150–440)
RBC: 3.25 MIL/uL — ABNORMAL LOW (ref 4.40–5.90)
RDW: 14.2 % (ref 11.5–14.5)
WBC: 5.9 10*3/uL (ref 3.8–10.6)

## 2014-06-05 LAB — COMPREHENSIVE METABOLIC PANEL
ALK PHOS: 140 U/L — AB (ref 38–126)
ALT: 25 U/L (ref 17–63)
ANION GAP: 6 (ref 5–15)
AST: 76 U/L — ABNORMAL HIGH (ref 15–41)
Albumin: 3.5 g/dL (ref 3.5–5.0)
BILIRUBIN TOTAL: 0.6 mg/dL (ref 0.3–1.2)
BUN: 16 mg/dL (ref 6–20)
CHLORIDE: 100 mmol/L — AB (ref 101–111)
CO2: 27 mmol/L (ref 22–32)
CREATININE: 0.6 mg/dL — AB (ref 0.61–1.24)
Calcium: 9.4 mg/dL (ref 8.9–10.3)
GFR calc non Af Amer: >60 mL/min (ref >60)
GLUCOSE: 115 mg/dL — AB (ref 65–99)
Potassium: 3.8 mmol/L (ref 3.5–5.1)
Sodium: 133 mmol/L — ABNORMAL LOW (ref 135–145)
Total Protein: 7.9 g/dL (ref 6.5–8.1)

## 2014-06-05 MED ORDER — HEPARIN SOD (PORK) LOCK FLUSH 100 UNIT/ML IV SOLN
500.0000 [IU] | Freq: Once | INTRAVENOUS | Status: AC
  Administered 2014-06-05: 500 [IU] via INTRAVENOUS
  Filled 2014-06-05: qty 5

## 2014-06-05 MED ORDER — SODIUM CHLORIDE 0.9 % IV SOLN: 3.0000 mg/kg | Freq: Once | INTRAVENOUS | Status: DC

## 2014-06-05 MED ORDER — FENTANYL 50 MCG/HR TD PT72: 50.0000 ug | MEDICATED_PATCH | TRANSDERMAL | Status: DC

## 2014-06-05 MED ORDER — SODIUM CHLORIDE 0.9 % IV SOLN: Freq: Once | INTRAVENOUS | Status: DC

## 2014-06-05 MED ORDER — OXYCODONE HCL 5 MG PO CAPS: 10.0000 mg | ORAL_CAPSULE | ORAL | Status: DC | PRN

## 2014-06-05 MED ORDER — SODIUM CHLORIDE 0.9 % IJ SOLN
10.0000 mL | INTRAMUSCULAR | Status: AC | PRN
  Administered 2014-06-05: 10 mL via INTRAVENOUS
  Filled 2014-06-05: qty 10

## 2014-06-12 ENCOUNTER — Telehealth: Payer: Self-pay | Admitting: *Deleted

## 2014-06-12 NOTE — Telephone Encounter (Signed)
She feels he needs an antidepressant

## 2014-06-12 NOTE — Telephone Encounter (Signed)
Informed Dr Grayland Ormond said will discuss at next office visit. She stated tha twould be fine

## 2014-06-18 ENCOUNTER — Other Ambulatory Visit: Payer: Self-pay | Admitting: Oncology

## 2014-06-19 ENCOUNTER — Inpatient Hospital Stay (HOSPITAL_BASED_OUTPATIENT_CLINIC_OR_DEPARTMENT_OTHER): Payer: Medicaid Other | Admitting: Oncology

## 2014-06-19 ENCOUNTER — Inpatient Hospital Stay: Payer: Medicaid Other | Admitting: *Deleted

## 2014-06-19 ENCOUNTER — Inpatient Hospital Stay: Payer: Medicaid Other

## 2014-06-19 DIAGNOSIS — C3491 Malignant neoplasm of unspecified part of right bronchus or lung: Secondary | ICD-10-CM

## 2014-06-19 DIAGNOSIS — R5383 Other fatigue: Secondary | ICD-10-CM

## 2014-06-19 DIAGNOSIS — C787 Secondary malignant neoplasm of liver and intrahepatic bile duct: Secondary | ICD-10-CM | POA: Diagnosis not present

## 2014-06-19 DIAGNOSIS — R634 Abnormal weight loss: Secondary | ICD-10-CM

## 2014-06-19 DIAGNOSIS — C771 Secondary and unspecified malignant neoplasm of intrathoracic lymph nodes: Secondary | ICD-10-CM | POA: Diagnosis not present

## 2014-06-19 DIAGNOSIS — I10 Essential (primary) hypertension: Secondary | ICD-10-CM

## 2014-06-19 DIAGNOSIS — Z5111 Encounter for antineoplastic chemotherapy: Secondary | ICD-10-CM | POA: Diagnosis not present

## 2014-06-19 DIAGNOSIS — R630 Anorexia: Secondary | ICD-10-CM

## 2014-06-19 DIAGNOSIS — Z79899 Other long term (current) drug therapy: Secondary | ICD-10-CM

## 2014-06-19 DIAGNOSIS — T451X5S Adverse effect of antineoplastic and immunosuppressive drugs, sequela: Secondary | ICD-10-CM

## 2014-06-19 DIAGNOSIS — J449 Chronic obstructive pulmonary disease, unspecified: Secondary | ICD-10-CM

## 2014-06-19 DIAGNOSIS — G620 Drug-induced polyneuropathy: Secondary | ICD-10-CM | POA: Diagnosis not present

## 2014-06-19 DIAGNOSIS — R5381 Other malaise: Secondary | ICD-10-CM

## 2014-06-19 LAB — CBC WITH DIFFERENTIAL/PLATELET
Basophils Absolute: 0 10*3/uL (ref 0–0.1)
Basophils Relative: 0 %
EOS ABS: 0 10*3/uL (ref 0–0.7)
Eosinophils Relative: 1 %
HCT: 28.4 % — ABNORMAL LOW (ref 40.0–52.0)
Hemoglobin: 9.4 g/dL — ABNORMAL LOW (ref 13.0–18.0)
Lymphocytes Relative: 20 %
Lymphs Abs: 1.6 10*3/uL (ref 1.0–3.6)
MCH: 30.4 pg (ref 26.0–34.0)
MCHC: 33.1 g/dL (ref 32.0–36.0)
MCV: 92 fL (ref 80.0–100.0)
MONO ABS: 1.1 10*3/uL — AB (ref 0.2–1.0)
MONOS PCT: 14 %
Neutro Abs: 5 10*3/uL (ref 1.4–6.5)
Neutrophils Relative %: 65 %
Platelets: 275 10*3/uL (ref 150–440)
RBC: 3.09 MIL/uL — ABNORMAL LOW (ref 4.40–5.90)
RDW: 14.5 % (ref 11.5–14.5)
WBC: 7.7 10*3/uL (ref 3.8–10.6)

## 2014-06-19 LAB — COMPREHENSIVE METABOLIC PANEL
ALT: 24 U/L (ref 17–63)
AST: 132 U/L — AB (ref 15–41)
Albumin: 3.3 g/dL — ABNORMAL LOW (ref 3.5–5.0)
Alkaline Phosphatase: 177 U/L — ABNORMAL HIGH (ref 38–126)
Anion gap: 8 (ref 5–15)
BUN: 20 mg/dL (ref 6–20)
CHLORIDE: 96 mmol/L — AB (ref 101–111)
CO2: 25 mmol/L (ref 22–32)
Calcium: 9.1 mg/dL (ref 8.9–10.3)
Creatinine, Ser: 0.77 mg/dL (ref 0.61–1.24)
GFR calc non Af Amer: >60 mL/min (ref >60)
Glucose, Bld: 104 mg/dL — ABNORMAL HIGH (ref 65–99)
Potassium: 4.2 mmol/L (ref 3.5–5.1)
Sodium: 129 mmol/L — ABNORMAL LOW (ref 135–145)
TOTAL PROTEIN: 8.2 g/dL — AB (ref 6.5–8.1)
Total Bilirubin: 0.6 mg/dL (ref 0.3–1.2)

## 2014-06-19 MED ORDER — HEPARIN SOD (PORK) LOCK FLUSH 100 UNIT/ML IV SOLN
500.0000 [IU] | Freq: Once | INTRAVENOUS | Status: AC | PRN
  Administered 2014-06-19: 500 [IU]
  Filled 2014-06-19: qty 5

## 2014-06-19 MED ORDER — SODIUM CHLORIDE 0.9 % IV SOLN
3.0000 mg/kg | Freq: Once | INTRAVENOUS | Status: DC
  Administered 2014-06-19: 220 mg via INTRAVENOUS
  Filled 2014-06-19: qty 6

## 2014-06-19 MED ORDER — SODIUM CHLORIDE 0.9 % IV SOLN
Freq: Once | INTRAVENOUS | Status: AC
  Administered 2014-06-19: 15:00:00 via INTRAVENOUS
  Filled 2014-06-19: qty 250

## 2014-06-19 MED ORDER — SODIUM CHLORIDE 0.9 % IV SOLN: 3.0000 mg/kg | Freq: Once | INTRAVENOUS | Status: DC

## 2014-06-19 MED ORDER — SODIUM CHLORIDE 0.9 % IJ SOLN
10.0000 mL | INTRAMUSCULAR | Status: DC | PRN
  Filled 2014-06-19: qty 10

## 2014-06-22 NOTE — Progress Notes (Signed)
[place]  Telephone:(336) [phone] Fax:(336) [phone]  ID: Mark Lynn OB: March 14, 1960  MR#: [id number]  GYK#:[id number]  Patient Care Team: No Pcp Per Patient as PCP - General (General Practice)  CHIEF COMPLAINT:  Chief Complaint  Patient presents with  . Follow-up    lung cancer  . Chemotherapy    new opdivo    INTERVAL HISTORY: Patient to clinic today for further evaluation and reconsideration of cycle 1 of nivolumab. He continues to have peripheral neuropathy which is unchanged. He also has a poor appetite. He otherwise feels well.  He does not complain of abdominal pain today.  He has no other neurologic complaints. He denies any recent fevers. He denies any nausea, vomiting, constipation, or diarrhea. He has no melena or hematochezia. He has no urinary complaints. Patient offers no further specific complaints today.   REVIEW OF SYSTEMS:   Review of Systems  Constitutional: Positive for weight loss and malaise/fatigue.  Respiratory: Negative.   Cardiovascular: Negative.   Neurological: Positive for sensory change and weakness.    As per HPI. Otherwise, a complete review of systems is negatve.  PAST MEDICAL HISTORY: Past Medical History  Diagnosis Date  . Lung cancer   . COPD (chronic obstructive pulmonary disease)   . Hypertension     PAST SURGICAL HISTORY: No past surgical history on file.  FAMILY HISTORY:  Reviewed and unchanged. No report of malignancy or chronic disease.     ADVANCED DIRECTIVES:    HEALTH MAINTENANCE: History  Substance Use Topics  . Smoking status: Current Every Day Smoker -- 1.00 packs/day for 30 years    Types: Cigarettes  . Smokeless tobacco: Not on file  . Alcohol Use: No     Colonoscopy:  PAP:  Bone density:  Lipid panel:  Allergies  Allergen Reactions  . Penicillin G Other (See Comments)    Current Outpatient Prescriptions  Medication Sig Dispense Refill  . chlorproMAZINE (THORAZINE) 10 MG  tablet Take 10 mg by mouth 3 (three) times daily.    . cloNIDine (CATAPRES) 0.3 MG tablet Take 0.3 mg by mouth 2 (two) times daily.    . fentaNYL (DURAGESIC - DOSED MCG/HR) 50 MCG/HR Place 1 patch (50 mcg total) onto the skin every 3 (three) days. 10 patch 0  . furosemide (LASIX) 20 MG tablet Take 20 mg by mouth daily.    . hydrALAZINE (APRESOLINE) 10 MG tablet Take 10 mg by mouth 4 (four) times daily.    . megestrol (MEGACE) 40 MG/ML suspension Take 800 mg by mouth daily.    Marland Kitchen oxycodone (OXY-IR) 5 MG capsule Take 2 capsules (10 mg total) by mouth every 4 (four) hours as needed. 120 capsule 0  . prochlorperazine (COMPAZINE) 10 MG tablet Take 10 mg by mouth every 6 (six) hours as needed for nausea or vomiting.     No current facility-administered medications for this visit.   Facility-Administered Medications Ordered in Other Visits  Medication Dose Route Frequency Provider Last Rate Last Dose  . sodium chloride 0.9 % injection 10 mL  10 mL Intravenous PRN Lloyd Huger, MD   10 mL at 06/05/14 1330    OBJECTIVE: Filed Vitals:   06/19/14 1434  BP: 127/77  Pulse: 75  Temp: 97.7 F (36.5 C)  Resp: 20     Body mass index is 20.5 kg/(m^2).    ECOG FS:1 - Symptomatic but completely ambulatory  General: Thin, no acute distress. Eyes: anicteric sclera. Lungs: Clear to auscultation bilaterally. Heart:  Regular rate and rhythm. No rubs, murmurs, or gallops. Abdomen: Soft, nontender, nondistended. No organomegaly noted, normoactive bowel sounds. Musculoskeletal: No edema, cyanosis, or clubbing. Neuro: Alert, answering all questions appropriately. Cranial nerves grossly intact. Skin: No rashes or petechiae noted. Psych: Normal affect.   LAB RESULTS:  Lab Results  Component Value Date   NA 129* 06/19/2014   K 4.2 06/19/2014   CL 96* 06/19/2014   CO2 25 06/19/2014   GLUCOSE 104* 06/19/2014   BUN 20 06/19/2014   CREATININE 0.77 06/19/2014   CALCIUM 9.1 06/19/2014   PROT 8.2*  06/19/2014   ALBUMIN 3.3* 06/19/2014   AST 132* 06/19/2014   ALT 24 06/19/2014   ALKPHOS 177* 06/19/2014   BILITOT 0.6 06/19/2014   GFRNONAA >60 06/19/2014   GFRAA >60 06/19/2014    Lab Results  Component Value Date   WBC 7.7 06/19/2014   NEUTROABS 5.0 06/19/2014   HGB 9.4* 06/19/2014   HCT 28.4* 06/19/2014   MCV 92.0 06/19/2014   PLT 275 06/19/2014     STUDIES: No results found.  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  PLAN:    1. Lung cancer: Restaging CT scan reviewed independently and reported with overall improvement of disease burden. Because of patient's peripheral neuropathy, will discontinue carboplatinum and Taxol. Proceed with cycle 1 of nivolumab today. He will receive this every 2 weeks. Return to clinic in 2 weeks for consideration of cycle 2. Plan to reimage in August 2016.  2. Pain: Possibly secondary to metastatic disease. Continue Duragesic patch as well as oxycodone as needed. Appreciate palliative care input. 3. Hypertension:  Improved. Continue current regimen, monitor. 4. Elevated liver enzymes: Resolved, monitor. 5. Nausea: Patient was instructed to take his Compazine as directed. 6. 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 Discontinue Taxol as above. 7. Poor appetite: Patient was given a prescription for Megace today.   Patient expressed understanding and was in agreement with this plan. He also understands that He can call clinic at any time with any questions, concerns, or complaints.   Squamous cell lung cancer   Staging form: Lung, AJCC 7th Edition     Clinical stage from 06/02/2014: T3, N3, M1 - Signed by Lloyd Huger, MD on 06/02/2014   Lloyd Huger, MD   06/22/2014 4:24 PM

## 2014-06-22 NOTE — Progress Notes (Signed)
[place]  Telephone:(336) [phone] Fax:(336) [phone]  ID: Mark Lynn OB: 1960/09/20  MR#: [id number]  OEU#:[id number]  Patient Care Team: No Pcp Per Patient as PCP - General (General Practice)  CHIEF COMPLAINT:  Chief Complaint  Patient presents with  . Follow-up    Lung Ca liver mets  . Chemotherapy    INTERVAL HISTORY: Patient to clinic today for further evaluation and education of cycle 1 of nivolumab.  discussion of his imaging results. He continues to have peripheral neuropathy which is unchanged. He otherwise feels well.  He does not complain of abdominal pain today.  He has no other neurologic complaints. He denies any recent fevers. He has a good appetite and denies weight loss. He denies any nausea, vomiting, constipation, or diarrhea. He has no melena or hematochezia. He has no urinary complaints. Patient offers no further specific complaints today.   REVIEW OF SYSTEMS:   Review of Systems  Constitutional: Positive for weight loss.  Neurological: Positive for sensory change.    As per HPI. Otherwise, a complete review of systems is negatve.  PAST MEDICAL HISTORY: Past Medical History  Diagnosis Date  . Lung cancer   . COPD (chronic obstructive pulmonary disease)   . Hypertension     PAST SURGICAL HISTORY: No past surgical history on file.  FAMILY HISTORY No family history on file.     ADVANCED DIRECTIVES:    HEALTH MAINTENANCE: History  Substance Use Topics  . Smoking status: Current Every Day Smoker -- 1.00 packs/day for 30 years    Types: Cigarettes  . Smokeless tobacco: Not on file  . Alcohol Use: No     Colonoscopy:  PAP:  Bone density:  Lipid panel:  Allergies  Allergen Reactions  . Penicillin G Other (See Comments)    Current Outpatient Prescriptions  Medication Sig Dispense Refill  . chlorproMAZINE (THORAZINE) 10 MG tablet Take 10 mg by mouth 3 (three) times daily.    . cloNIDine (CATAPRES) 0.3 MG tablet  Take 0.3 mg by mouth 2 (two) times daily.    . fentaNYL (DURAGESIC - DOSED MCG/HR) 50 MCG/HR Place 1 patch (50 mcg total) onto the skin every 3 (three) days. 10 patch 0  . furosemide (LASIX) 20 MG tablet Take 20 mg by mouth daily.    . hydrALAZINE (APRESOLINE) 10 MG tablet Take 10 mg by mouth 4 (four) times daily.    . megestrol (MEGACE) 40 MG/ML suspension Take 800 mg by mouth daily.    Marland Kitchen oxycodone (OXY-IR) 5 MG capsule Take 2 capsules (10 mg total) by mouth every 4 (four) hours as needed. 120 capsule 0  . prochlorperazine (COMPAZINE) 10 MG tablet Take 10 mg by mouth every 6 (six) hours as needed for nausea or vomiting.     No current facility-administered medications for this visit.   Facility-Administered Medications Ordered in Other Visits  Medication Dose Route Frequency Provider Last Rate Last Dose  . sodium chloride 0.9 % injection 10 mL  10 mL Intravenous PRN Lloyd Huger, MD   10 mL at 06/05/14 1330    OBJECTIVE: Filed Vitals:   06/05/14 1420  BP: 133/85  Pulse: 73  Temp: 97.7 F (36.5 C)  Resp: 18     Body mass index is 20.62 kg/(m^2).    ECOG FS:1 - Symptomatic but completely ambulatory  General: Thin, no acute distress. Eyes: anicteric sclera. Lungs: Clear to auscultation bilaterally. Heart: Regular rate and rhythm. No rubs, murmurs, or gallops. Abdomen: Soft, nontender,  nondistended. No organomegaly noted, normoactive bowel sounds. Musculoskeletal: No edema, cyanosis, or clubbing. Neuro: Alert, answering all questions appropriately. Cranial nerves grossly intact. Skin: No rashes or petechiae noted. Psych: Normal affect.   LAB RESULTS:  Lab Results  Component Value Date   NA 129* 06/19/2014   K 4.2 06/19/2014   CL 96* 06/19/2014   CO2 25 06/19/2014   GLUCOSE 104* 06/19/2014   BUN 20 06/19/2014   CREATININE 0.77 06/19/2014   CALCIUM 9.1 06/19/2014   PROT 8.2* 06/19/2014   ALBUMIN 3.3* 06/19/2014   AST 132* 06/19/2014   ALT 24 06/19/2014   ALKPHOS  177* 06/19/2014   BILITOT 0.6 06/19/2014   GFRNONAA >60 06/19/2014   GFRAA >60 06/19/2014    Lab Results  Component Value Date   WBC 7.7 06/19/2014   NEUTROABS 5.0 06/19/2014   HGB 9.4* 06/19/2014   HCT 28.4* 06/19/2014   MCV 92.0 06/19/2014   PLT 275 06/19/2014     STUDIES: No results found.  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  PLAN:    1. Lung cancer: Restaging CT scan reviewed independently and reported with overall improvement of disease burden. Because of patient's peripheral neuropathy, will discontinue carboplatinum and Taxol. Patient was planned to initiate cycle 1 of nivolumab today, but could not secondary to insurance purposes. In 1 week for initiation of cycle 1.  He will receive this every 2 weeks. Plan to reimage in August 2016.  2. Pain: Possibly secondary to metastatic disease. Continue Duragesic patch as well as oxycodone as needed. Appreciate palliative care input. 3. Hypertension:  Improved. Continue current regimen, monitor. 4. Elevated liver enzymes: Resolved, monitor. 5. Nausea: Patient was instructed to take his Compazine as directed. 6. 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 Discontinue Taxol as above.   Patient expressed understanding and was in agreement with this plan. He also understands that He can call clinic at any time with any questions, concerns, or complaints.   Squamous cell lung cancer   Staging form: Lung, AJCC 7th Edition     Clinical stage from 06/02/2014: T3, N3, M1 - Signed by Lloyd Huger, MD on 06/02/2014   Lloyd Huger, MD   06/22/2014 4:06 PM

## 2014-06-26 ENCOUNTER — Telehealth: Payer: Self-pay | Admitting: *Deleted

## 2014-06-26 MED ORDER — OXYCODONE HCL 5 MG PO CAPS: 10.0000 mg | ORAL_CAPSULE | ORAL | Status: DC | PRN

## 2014-06-26 MED ORDER — HYDRALAZINE HCL 10 MG PO TABS: 10.0000 mg | ORAL_TABLET | Freq: Four times a day (QID) | ORAL | Status: DC

## 2014-06-26 MED ORDER — ALPRAZOLAM 0.25 MG PO TABS: 0.2500 mg | ORAL_TABLET | Freq: Three times a day (TID) | ORAL | Status: AC | PRN

## 2014-06-26 MED ORDER — CLONIDINE HCL 0.3 MG PO TABS: 0.3000 mg | ORAL_TABLET | Freq: Two times a day (BID) | ORAL | Status: AC

## 2014-06-26 MED ORDER — HYDRALAZINE HCL 100 MG PO TABS
100.0000 mg | ORAL_TABLET | Freq: Four times a day (QID) | ORAL | Status: AC | PRN
Start: 2014-06-26 — End: ?

## 2014-06-26 NOTE — Telephone Encounter (Signed)
Informed that prescription is ready to pick up  

## 2014-06-28 ENCOUNTER — Other Ambulatory Visit: Payer: Self-pay | Admitting: *Deleted

## 2014-06-28 MED ORDER — FENTANYL 50 MCG/HR TD PT72: 50.0000 ug | MEDICATED_PATCH | TRANSDERMAL | Status: DC

## 2014-06-28 MED ORDER — OXYCODONE HCL 5 MG PO CAPS: 10.0000 mg | ORAL_CAPSULE | ORAL | Status: DC | PRN

## 2014-07-01 ENCOUNTER — Other Ambulatory Visit: Payer: Self-pay | Admitting: Oncology

## 2014-07-01 DIAGNOSIS — C3491 Malignant neoplasm of unspecified part of right bronchus or lung: Secondary | ICD-10-CM

## 2014-07-03 ENCOUNTER — Inpatient Hospital Stay: Payer: Medicaid Other

## 2014-07-03 ENCOUNTER — Inpatient Hospital Stay (HOSPITAL_BASED_OUTPATIENT_CLINIC_OR_DEPARTMENT_OTHER): Payer: Medicaid Other | Admitting: Oncology

## 2014-07-03 ENCOUNTER — Inpatient Hospital Stay: Payer: Medicaid Other | Attending: Oncology

## 2014-07-03 VITALS — BP 121/81 | HR 73 | Temp 96.1°F | Resp 20 | Wt 155.0 lb

## 2014-07-03 DIAGNOSIS — F17210 Nicotine dependence, cigarettes, uncomplicated: Secondary | ICD-10-CM | POA: Insufficient documentation

## 2014-07-03 DIAGNOSIS — T451X5S Adverse effect of antineoplastic and immunosuppressive drugs, sequela: Secondary | ICD-10-CM

## 2014-07-03 DIAGNOSIS — G629 Polyneuropathy, unspecified: Secondary | ICD-10-CM | POA: Insufficient documentation

## 2014-07-03 DIAGNOSIS — C787 Secondary malignant neoplasm of liver and intrahepatic bile duct: Secondary | ICD-10-CM | POA: Insufficient documentation

## 2014-07-03 DIAGNOSIS — R531 Weakness: Secondary | ICD-10-CM

## 2014-07-03 DIAGNOSIS — C3491 Malignant neoplasm of unspecified part of right bronchus or lung: Secondary | ICD-10-CM | POA: Diagnosis not present

## 2014-07-03 DIAGNOSIS — R630 Anorexia: Secondary | ICD-10-CM | POA: Insufficient documentation

## 2014-07-03 DIAGNOSIS — R5383 Other fatigue: Secondary | ICD-10-CM | POA: Diagnosis not present

## 2014-07-03 DIAGNOSIS — R52 Pain, unspecified: Secondary | ICD-10-CM | POA: Diagnosis not present

## 2014-07-03 DIAGNOSIS — J449 Chronic obstructive pulmonary disease, unspecified: Secondary | ICD-10-CM | POA: Diagnosis not present

## 2014-07-03 DIAGNOSIS — Z79899 Other long term (current) drug therapy: Secondary | ICD-10-CM | POA: Insufficient documentation

## 2014-07-03 DIAGNOSIS — I10 Essential (primary) hypertension: Secondary | ICD-10-CM | POA: Diagnosis not present

## 2014-07-03 DIAGNOSIS — R110 Nausea: Secondary | ICD-10-CM | POA: Diagnosis not present

## 2014-07-03 DIAGNOSIS — R5381 Other malaise: Secondary | ICD-10-CM | POA: Diagnosis not present

## 2014-07-03 DIAGNOSIS — Z5111 Encounter for antineoplastic chemotherapy: Secondary | ICD-10-CM | POA: Diagnosis present

## 2014-07-03 LAB — CBC WITH DIFFERENTIAL/PLATELET
BASOS PCT: 1 %
Basophils Absolute: 0.1 10*3/uL (ref 0–0.1)
EOS ABS: 0 10*3/uL (ref 0–0.7)
Eosinophils Relative: 0 %
HEMATOCRIT: 29.5 % — AB (ref 40.0–52.0)
Hemoglobin: 9.7 g/dL — ABNORMAL LOW (ref 13.0–18.0)
LYMPHS ABS: 1.3 10*3/uL (ref 1.0–3.6)
LYMPHS PCT: 15 %
MCH: 29.6 pg (ref 26.0–34.0)
MCHC: 32.9 g/dL (ref 32.0–36.0)
MCV: 90.1 fL (ref 80.0–100.0)
MONOS PCT: 10 %
Monocytes Absolute: 0.9 10*3/uL (ref 0.2–1.0)
NEUTROS ABS: 6.3 10*3/uL (ref 1.4–6.5)
Neutrophils Relative %: 74 %
PLATELETS: 296 10*3/uL (ref 150–440)
RBC: 3.28 MIL/uL — ABNORMAL LOW (ref 4.40–5.90)
RDW: 15.8 % — AB (ref 11.5–14.5)
WBC: 8.6 10*3/uL (ref 3.8–10.6)

## 2014-07-03 LAB — COMPREHENSIVE METABOLIC PANEL
ALBUMIN: 2.9 g/dL — AB (ref 3.5–5.0)
ALT: 28 U/L (ref 17–63)
AST: 191 U/L — ABNORMAL HIGH (ref 15–41)
Alkaline Phosphatase: 296 U/L — ABNORMAL HIGH (ref 38–126)
Anion gap: 11 (ref 5–15)
BUN: 15 mg/dL (ref 6–20)
CO2: 26 mmol/L (ref 22–32)
Calcium: 10.8 mg/dL — ABNORMAL HIGH (ref 8.9–10.3)
Chloride: 98 mmol/L — ABNORMAL LOW (ref 101–111)
Creatinine, Ser: 0.78 mg/dL (ref 0.61–1.24)
GFR calc non Af Amer: >60 mL/min (ref >60)
GLUCOSE: 106 mg/dL — AB (ref 65–99)
POTASSIUM: 4.3 mmol/L (ref 3.5–5.1)
Sodium: 135 mmol/L (ref 135–145)
TOTAL PROTEIN: 8.1 g/dL (ref 6.5–8.1)
Total Bilirubin: 0.6 mg/dL (ref 0.3–1.2)

## 2014-07-03 LAB — TSH: TSH: 1.701 u[IU]/mL (ref 0.350–4.500)

## 2014-07-03 MED ORDER — NIVOLUMAB CHEMO INJECTION 100 MG/10ML
3.0000 mg/kg | Freq: Once | INTRAVENOUS | Status: AC
  Administered 2014-07-03: 220 mg via INTRAVENOUS
  Filled 2014-07-03: qty 22

## 2014-07-03 MED ORDER — SODIUM CHLORIDE 0.9 % IV SOLN
Freq: Once | INTRAVENOUS | Status: AC
  Administered 2014-07-03: 15:00:00 via INTRAVENOUS
  Filled 2014-07-03: qty 1000

## 2014-07-03 MED ORDER — SODIUM CHLORIDE 0.9 % IJ SOLN
10.0000 mL | INTRAMUSCULAR | Status: DC | PRN
  Administered 2014-07-03: 10 mL
  Filled 2014-07-03: qty 10

## 2014-07-03 MED ORDER — HEPARIN SOD (PORK) LOCK FLUSH 100 UNIT/ML IV SOLN
500.0000 [IU] | Freq: Once | INTRAVENOUS | Status: AC | PRN
  Administered 2014-07-03: 500 [IU]
  Filled 2014-07-03: qty 5

## 2014-07-10 NOTE — Progress Notes (Signed)
[place]  Telephone:(336) [phone] Fax:(336) [phone]  ID: Mark Lynn OB: [birth year]  MR#: [id number]  WSF#:[id number]  Patient Care Team: No Pcp Per Patient as PCP - General (General Practice)  CHIEF COMPLAINT:  Chief Complaint  Patient presents with  . Follow-up    lung cancer    INTERVAL HISTORY: Patient to clinic today for further evaluation and consideration of cycle 2 of nivolumab. He continues to have peripheral neuropathy which is unchanged. He is having increased pain. His appetite is slightly improved. He has no other neurologic complaints. He denies any recent fevers. He denies any nausea, vomiting, constipation, or diarrhea. He has no melena or hematochezia. He has no urinary complaints. Patient offers no further specific complaints today.   REVIEW OF SYSTEMS:   Review of Systems  Constitutional: Positive for malaise/fatigue. Negative for fever.  Respiratory: Negative.   Cardiovascular: Negative.   Neurological: Positive for sensory change and weakness.    As per HPI. Otherwise, a complete review of systems is negatve.  PAST MEDICAL HISTORY: Past Medical History  Diagnosis Date  . Lung cancer   . COPD (chronic obstructive pulmonary disease)   . Hypertension     PAST SURGICAL HISTORY: No past surgical history on file.  FAMILY HISTORY:  Reviewed and unchanged. No report of malignancy or chronic disease.     ADVANCED DIRECTIVES:    HEALTH MAINTENANCE: History  Substance Use Topics  . Smoking status: Current Every Day Smoker -- 1.00 packs/day for 30 years    Types: Cigarettes  . Smokeless tobacco: Not on file  . Alcohol Use: No     Colonoscopy:  PAP:  Bone density:  Lipid panel:  Allergies  Allergen Reactions  . Penicillin G Other (See Comments)    Current Outpatient Prescriptions  Medication Sig Dispense Refill  . ALPRAZolam (XANAX) 0.25 MG tablet Take 1 tablet (0.25 mg total) by mouth 3 (three) times daily as  needed for anxiety. 60 tablet 0  . chlorproMAZINE (THORAZINE) 10 MG tablet Take 10 mg by mouth 3 (three) times daily.    . cloNIDine (CATAPRES) 0.3 MG tablet Take 1 tablet (0.3 mg total) by mouth 2 (two) times daily. 60 tablet 0  . fentaNYL (DURAGESIC - DOSED MCG/HR) 50 MCG/HR Place 1 patch (50 mcg total) onto the skin every 3 (three) days. 10 patch 0  . furosemide (LASIX) 20 MG tablet Take 20 mg by mouth daily.    . hydrALAZINE (APRESOLINE) 100 MG tablet Take 1 tablet (100 mg total) by mouth 4 (four) times daily as needed. 120 tablet 2  . megestrol (MEGACE) 40 MG/ML suspension Take 800 mg by mouth daily.    Marland Kitchen oxycodone (OXY-IR) 5 MG capsule Take 2 capsules (10 mg total) by mouth every 4 (four) hours as needed. 120 capsule 0  . prochlorperazine (COMPAZINE) 10 MG tablet Take 10 mg by mouth every 6 (six) hours as needed for nausea or vomiting.     No current facility-administered medications for this visit.   Facility-Administered Medications Ordered in Other Visits  Medication Dose Route Frequency Provider Last Rate Last Dose  . sodium chloride 0.9 % injection 10 mL  10 mL Intravenous PRN Lloyd Huger, MD   10 mL at 06/05/14 1330    OBJECTIVE: Filed Vitals:   07/03/14 1404  BP: 121/81  Pulse: 73  Temp: 96.1 F (35.6 C)  Resp: 20     Body mass index is 20.1 kg/(m^2).    ECOG FS:1 - Symptomatic  but completely ambulatory  General: Thin, no acute distress. Eyes: anicteric sclera. Lungs: Clear to auscultation bilaterally. Heart: Regular rate and rhythm. No rubs, murmurs, or gallops. Abdomen: Soft, nontender, nondistended. No organomegaly noted, normoactive bowel sounds. Musculoskeletal: No edema, cyanosis, or clubbing. Neuro: Alert, answering all questions appropriately. Cranial nerves grossly intact. Skin: No rashes or petechiae noted. Psych: Normal affect.   LAB RESULTS:  Lab Results  Component Value Date   NA 135 07/03/2014   K 4.3 07/03/2014   CL 98* 07/03/2014   CO2  26 07/03/2014   GLUCOSE 106* 07/03/2014   BUN 15 07/03/2014   CREATININE 0.78 07/03/2014   CALCIUM 10.8* 07/03/2014   PROT 8.1 07/03/2014   ALBUMIN 2.9* 07/03/2014   AST 191* 07/03/2014   ALT 28 07/03/2014   ALKPHOS 296* 07/03/2014   BILITOT 0.6 07/03/2014   GFRNONAA >60 07/03/2014   GFRAA >60 07/03/2014    Lab Results  Component Value Date   WBC 8.6 07/03/2014   NEUTROABS 6.3 07/03/2014   HGB 9.7* 07/03/2014   HCT 29.5* 07/03/2014   MCV 90.1 07/03/2014   PLT 296 07/03/2014     STUDIES: No results found.  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  PLAN:    1. Lung cancer: Restaging CT scan reviewed independently and reported with overall improvement of disease burden. Because of patient's peripheral neuropathy, will discontinue carboplatinum and Taxol. Proceed with cycle 2 of nivolumab today. He will receive this every 2 weeks. Return to clinic in 2 weeks for consideration of cycle 3. Plan to reimage in ~August 2016.  2. Pain: Possibly secondary to metastatic disease. Continue Duragesic patch, increase oxycodone as needed. 3. Hypertension:  Improved. Continue current regimen, monitor. 4. Elevated liver enzymes: Resolved, monitor. 5. Nausea: Patient was instructed to take his Compazine as directed. 6. 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 Discontinue Taxol as above. 7. Poor appetite: Continue Megace.  Patient expressed understanding and was in agreement with this plan. He also understands that He can call clinic at any time with any questions, concerns, or complaints.   Squamous cell lung cancer   Staging form: Lung, AJCC 7th Edition     Clinical stage from 06/02/2014: T3, N3, M1 - Signed by Lloyd Huger, MD on 06/02/2014   Lloyd Huger, MD   07/10/2014 5:01 PM

## 2014-07-12 ENCOUNTER — Other Ambulatory Visit: Payer: Self-pay | Admitting: *Deleted

## 2014-07-13 ENCOUNTER — Other Ambulatory Visit: Payer: Self-pay | Admitting: Oncology

## 2014-07-13 DIAGNOSIS — C3490 Malignant neoplasm of unspecified part of unspecified bronchus or lung: Secondary | ICD-10-CM

## 2014-07-13 MED ORDER — OXYCODONE HCL 5 MG PO TABS: 10.0000 mg | ORAL_TABLET | ORAL | Status: AC | PRN

## 2014-07-13 MED ORDER — FENTANYL 100 MCG/HR TD PT72
100.0000 ug | MEDICATED_PATCH | TRANSDERMAL | Status: AC
Start: 2014-07-13 — End: ?

## 2014-07-13 NOTE — Telephone Encounter (Signed)
Informed that prescription is ready to pick up  

## 2014-07-16 ENCOUNTER — Telehealth: Payer: Self-pay

## 2014-07-16 NOTE — Telephone Encounter (Signed)
Patient's medications have been previously covered by the [employer], called [employer] to inform.  Also returned call to patient's mother.

## 2014-07-16 NOTE — Telephone Encounter (Signed)
-----   Message from Cephus Richer sent at 07/16/2014  9:42 AM EDT ----- Regarding: MEDS Per pt mom pt need script filled. Barnabas Lister usually get it done at [facility]. She states Randell Patient out of office how do she get his meds?[phone].  Thanks

## 2014-07-17 ENCOUNTER — Inpatient Hospital Stay: Payer: Medicaid Other

## 2014-07-17 ENCOUNTER — Ambulatory Visit
Admission: RE | Admit: 2014-07-17 | Discharge: 2014-07-17 | Disposition: A | Discharge status: Home / Self Care | Payer: Medicaid Other | Source: Ambulatory Visit | Attending: Hematology and Oncology | Admitting: Hematology and Oncology

## 2014-07-17 ENCOUNTER — Inpatient Hospital Stay: Payer: Medicaid Other | Attending: Oncology | Admitting: Hematology and Oncology

## 2014-07-17 VITALS — BP 102/68 | HR 82 | Temp 97.7°F | Resp 20 | Wt 154.1 lb

## 2014-07-17 DIAGNOSIS — C3490 Malignant neoplasm of unspecified part of unspecified bronchus or lung: Secondary | ICD-10-CM | POA: Insufficient documentation

## 2014-07-17 DIAGNOSIS — C787 Secondary malignant neoplasm of liver and intrahepatic bile duct: Secondary | ICD-10-CM | POA: Diagnosis not present

## 2014-07-17 DIAGNOSIS — F17210 Nicotine dependence, cigarettes, uncomplicated: Secondary | ICD-10-CM

## 2014-07-17 DIAGNOSIS — Z5111 Encounter for antineoplastic chemotherapy: Secondary | ICD-10-CM | POA: Diagnosis not present

## 2014-07-17 DIAGNOSIS — R590 Localized enlarged lymph nodes: Secondary | ICD-10-CM | POA: Diagnosis not present

## 2014-07-17 DIAGNOSIS — R52 Pain, unspecified: Secondary | ICD-10-CM

## 2014-07-17 DIAGNOSIS — Z79899 Other long term (current) drug therapy: Secondary | ICD-10-CM

## 2014-07-17 DIAGNOSIS — I10 Essential (primary) hypertension: Secondary | ICD-10-CM

## 2014-07-17 DIAGNOSIS — C3491 Malignant neoplasm of unspecified part of right bronchus or lung: Secondary | ICD-10-CM

## 2014-07-17 DIAGNOSIS — R109 Unspecified abdominal pain: Secondary | ICD-10-CM | POA: Diagnosis present

## 2014-07-17 DIAGNOSIS — R5381 Other malaise: Secondary | ICD-10-CM

## 2014-07-17 DIAGNOSIS — J449 Chronic obstructive pulmonary disease, unspecified: Secondary | ICD-10-CM

## 2014-07-17 DIAGNOSIS — R601 Generalized edema: Secondary | ICD-10-CM | POA: Insufficient documentation

## 2014-07-17 DIAGNOSIS — K759 Inflammatory liver disease, unspecified: Secondary | ICD-10-CM | POA: Insufficient documentation

## 2014-07-17 DIAGNOSIS — R630 Anorexia: Secondary | ICD-10-CM

## 2014-07-17 DIAGNOSIS — R531 Weakness: Secondary | ICD-10-CM

## 2014-07-17 DIAGNOSIS — R140 Abdominal distension (gaseous): Secondary | ICD-10-CM | POA: Diagnosis present

## 2014-07-17 DIAGNOSIS — R5383 Other fatigue: Secondary | ICD-10-CM

## 2014-07-17 HISTORY — DX: Unspecified asthma, uncomplicated: J45.909

## 2014-07-17 LAB — CBC WITH DIFFERENTIAL/PLATELET
Basophils Absolute: 0.1 10*3/uL (ref 0–0.1)
Basophils Relative: 1 %
EOS PCT: 0 %
Eosinophils Absolute: 0 10*3/uL (ref 0–0.7)
HEMATOCRIT: 30.1 % — AB (ref 40.0–52.0)
Hemoglobin: 9.8 g/dL — ABNORMAL LOW (ref 13.0–18.0)
LYMPHS ABS: 1.4 10*3/uL (ref 1.0–3.6)
Lymphocytes Relative: 11 %
MCH: 28.5 pg (ref 26.0–34.0)
MCHC: 32.4 g/dL (ref 32.0–36.0)
MCV: 88 fL (ref 80.0–100.0)
Monocytes Absolute: 1.2 10*3/uL — ABNORMAL HIGH (ref 0.2–1.0)
Monocytes Relative: 9 %
Neutro Abs: 10.2 10*3/uL — ABNORMAL HIGH (ref 1.4–6.5)
Neutrophils Relative %: 79 %
Platelets: 262 10*3/uL (ref 150–440)
RBC: 3.42 MIL/uL — ABNORMAL LOW (ref 4.40–5.90)
RDW: 17.6 % — AB (ref 11.5–14.5)
WBC: 12.9 10*3/uL — ABNORMAL HIGH (ref 3.8–10.6)

## 2014-07-17 LAB — COMPREHENSIVE METABOLIC PANEL
ALT: 30 U/L (ref 17–63)
AST: 306 U/L — ABNORMAL HIGH (ref 15–41)
Albumin: 2.7 g/dL — ABNORMAL LOW (ref 3.5–5.0)
Alkaline Phosphatase: 360 U/L — ABNORMAL HIGH (ref 38–126)
Anion gap: 9 (ref 5–15)
BILIRUBIN TOTAL: 1.6 mg/dL — AB (ref 0.3–1.2)
BUN: 28 mg/dL — AB (ref 6–20)
CO2: 24 mmol/L (ref 22–32)
Calcium: 9.6 mg/dL (ref 8.9–10.3)
Chloride: 95 mmol/L — ABNORMAL LOW (ref 101–111)
Creatinine, Ser: 0.96 mg/dL (ref 0.61–1.24)
GFR calc Af Amer: >60 mL/min (ref >60)
GFR calc non Af Amer: >60 mL/min (ref >60)
Glucose, Bld: 81 mg/dL (ref 65–99)
POTASSIUM: 4.5 mmol/L (ref 3.5–5.1)
Sodium: 128 mmol/L — ABNORMAL LOW (ref 135–145)
TOTAL PROTEIN: 8 g/dL (ref 6.5–8.1)

## 2014-07-17 LAB — PROTIME-INR
INR: 1.32
Prothrombin Time: 16.6 seconds — ABNORMAL HIGH (ref 11.4–15.0)

## 2014-07-17 MED ORDER — SODIUM CHLORIDE 0.9 % IJ SOLN
10.0000 mL | INTRAMUSCULAR | Status: DC | PRN
  Filled 2014-07-17: qty 10

## 2014-07-17 MED ORDER — IOHEXOL 300 MG/ML  SOLN
100.0000 mL | Freq: Once | INTRAMUSCULAR | Status: AC | PRN
  Administered 2014-07-17: 100 mL via INTRAVENOUS

## 2014-07-17 MED ORDER — FIRST-DUKES MOUTHWASH MT SUSP: 5.0000 mL | Freq: Three times a day (TID) | OROMUCOSAL | Status: AC | PRN

## 2014-07-17 MED ORDER — HEPARIN SOD (PORK) LOCK FLUSH 100 UNIT/ML IV SOLN: 500.0000 [IU] | Freq: Once | INTRAVENOUS | Status: AC | PRN

## 2014-07-17 NOTE — Progress Notes (Signed)
Patient here today as acute add on for abdominal pain. Patient scheduled for lab and opdivo today, labs found to be abnormal prior to start of chemotherapy. Patient and family report abdominal pain 8/10, decreased appetite and weakness over the past week.

## 2014-07-18 LAB — CEA: CEA: 1.1 ng/mL (ref 0.0–4.7)

## 2014-07-18 LAB — HEPATITIS C ANTIBODY: HCV Ab: 0.5 s/co ratio (ref 0.0–0.9)

## 2014-07-18 LAB — HEPATITIS B SURFACE ANTIGEN: Hepatitis B Surface Ag: NEGATIVE

## 2014-07-18 LAB — HEPATITIS B CORE ANTIBODY, TOTAL: Hep B Core Total Ab: NEGATIVE

## 2014-07-24 ENCOUNTER — Telehealth: Payer: Self-pay | Admitting: Hematology and Oncology

## 2014-07-24 ENCOUNTER — Encounter: Payer: Self-pay | Admitting: Hematology and Oncology

## 2014-07-24 NOTE — Telephone Encounter (Signed)
  Re:  Results of CT scan  I spoke to the patient's mother, regarding his abdominal and pelvic CT scan. CT scan reveals rapidly progressive disease in the liver and lung. I discussed discontinuation of Opdivo. Given his general decline in performance status and rapidly progressive disease, I discussed Hospice.  It is unclear if he would respond to additional chemotherapy.  She stated that she would like to discuss this with Dr. Grayland Ormond when he returns next week.  An appointment with him will be scheduled.  His pain is controlled.  She felt comfortable bringing him home.  Lequita Asal, MD

## 2014-07-24 NOTE — Telephone Encounter (Signed)
  Re:  CT scan results  The patient's mother called today regarding review of her son's CT scan results. I re-reviewed with her details of the scan and his treatment to date. His liver lesions have dramatically increased in size (previously 4.1 x 5 cm to 9.3 x 6.3 cm). His right lower lob mass has also increased in size (previously 5.2 x 5 cm to 8.3 x 7 cm). She asked treatment options. I am unsure if he would respond to chemotherapy.  Options include carboplatin and etoposide or carboplatin and gemcitabine.  He is a candidate for hospice given his rapidly increasing metastatic disease.  She stated that he was resting comfortably this morning.  She would like to discuss with Dr. Grayland Ormond next week.  Lequita Asal, MD

## 2014-07-24 NOTE — Progress Notes (Signed)
[facility] day:  07/17/2014  Chief Complaint: Mark Lynn is a 54 y.o. male  with metastatic squamous cell lung who is seen for sick call visit her to cycle #3 Opdivo (nivolumab).  He was sent from the infusion center secondary to weakness, pain, and increased liver function tests.  HPI: The patient was last seen in the medical oncology clinic on 06/072016.  At that time, his peripheral neuropathy was unchanged. He was having some increased pain. Appetite was slightly improved. Liver function test included a bilirubin of 0.6, alkaline phosphatase 296, AST 191 and ALT 28.  He received his treatment uneventfully.  The patient states he continues to have significant pain. He rates his pain as worse in the morning. He is able to sleep 8 hours through the night. He is on a Fentanyl patch 100 mcg per hour and oxycodone 10 mg every 4 hours. His pain level is rated at an 8.  The patient's mother states that he see he was previously treated with carboplatinand Taxol. Imaging after 4 cycles noted improvement. After 6 cycles there was a "new spot on the liver". He was switched to Nivolumab.  He has had no problems with his chemotherapy. He states that his problems are "different". He is bothered by his stomach. He has used MiraLAX and Pepto-Bismol. He denies any diarrhea or constipation. His appetite is poor.  He denies any prior history of hepatitis.  Past Medical History  Diagnosis Date  . Lung cancer   . COPD (chronic obstructive pulmonary disease)   . Hypertension   . Asthma     No past surgical history on file.  No family history on file.  Social History:  reports that he has been smoking Cigarettes.  He has a 30 pack-year smoking history. He does not have any smokeless tobacco history on file. He reports that he uses illicit drugs (Cocaine and Marijuana). He reports that he does not drink alcohol.  The patient is accompanied by his  mother.  Allergies:  Allergies  Allergen Reactions  . Penicillin G Other (See Comments)    Current Medications: Current Outpatient Prescriptions  Medication Sig Dispense Refill  . ALPRAZolam (XANAX) 0.25 MG tablet Take 1 tablet (0.25 mg total) by mouth 3 (three) times daily as needed for anxiety. 60 tablet 0  . chlorproMAZINE (THORAZINE) 10 MG tablet Take 10 mg by mouth 3 (three) times daily.    . cloNIDine (CATAPRES) 0.3 MG tablet Take 1 tablet (0.3 mg total) by mouth 2 (two) times daily. 60 tablet 0  . fentaNYL (DURAGESIC - DOSED MCG/HR) 100 MCG/HR Place 1 patch (100 mcg total) onto the skin every other day. 15 patch 0  . furosemide (LASIX) 20 MG tablet Take 20 mg by mouth daily.    . hydrALAZINE (APRESOLINE) 100 MG tablet Take 1 tablet (100 mg total) by mouth 4 (four) times daily as needed. 120 tablet 2  . megestrol (MEGACE) 40 MG/ML suspension Take 800 mg by mouth daily.    Marland Kitchen oxyCODONE (OXY IR/ROXICODONE) 5 MG immediate release tablet Take 2 tablets (10 mg total) by mouth every 4 (four) hours as needed for severe pain (1-2 tabs as needed.). 120 tablet 0  . prochlorperazine (COMPAZINE) 10 MG tablet Take 10 mg by mouth every 6 (six) hours as needed for nausea or vomiting.    . Diphenhyd-Hydrocort-Nystatin (FIRST-DUKES MOUTHWASH) SUSP Use as directed 5 mLs in the mouth or throat 3 (three) times daily as  needed. 1 Bottle 1   No current facility-administered medications for this visit.   Facility-Administered Medications Ordered in Other Visits  Medication Dose Route Frequency Provider Last Rate Last Dose  . sodium chloride 0.9 % injection 10 mL  10 mL Intravenous PRN Lloyd Huger, MD   10 mL at 06/05/14 1330  . sodium chloride 0.9 % injection 10 mL  10 mL Intracatheter PRN Lloyd Huger, MD        Review of Systems:  GENERAL:  Feels bad.  No fevers or sweats.  Weight loss. PERFORMANCE STATUS (ECOG): 2-3 HEENT:  No visual changes, runny nose, sore throat, mouth sores or  tenderness. Lungs: No shortness of breath or cough.  No hemoptysis. Cardiac:  No chest pain, palpitations, orthopnea, or PND. GI:  Abdominal pain.  Appetite poor.  No nausea, vomiting, diarrhea, constipation, melena or hematochezia. GU:  No urgency, frequency, dysuria, or hematuria. Musculoskeletal:  No back pain.  No joint pain.  No muscle tenderness. Extremities:  No pain or swelling. Skin:  No rashes or skin changes. Neuro:  Neuropathy (stable).  No headache, focal weakness, balance or coordination issues. Endocrine:  No diabetes, thyroid issues, hot flashes or night sweats. Psych:  No mood changes, depression or anxiety. Pain:  Abdominal pain (no change in past 2 weeks). Review of systems:  All other systems reviewed and found to be negative.  Physical Exam: Blood pressure 102/68, pulse 82, temperature 97.7 F (36.5 C), temperature source Tympanic, resp. rate 20, weight 154 lb 1.6 oz (69.9 kg). GENERAL:  Cachetic gentleman sitting in a wheelchair n the exam room in no acute distress. MENTAL STATUS:  Alert and oriented to person, place and time. HEAD:  Short gray hair.  Normocephalic, atraumatic, face symmetric, no Cushingoid features. EYES:  Brown eyes.  Pupils equal round and reactive to light and accomodation.  Slight icterus.  No conjunctivitis. ENT:  Left buccal mucosa thrush.   Tongue normal. Mucous membranes moist.  RESPIRATORY:  Clear to auscultation without rales, wheezes or rhonchi. CARDIOVASCULAR:  Regular rate and rhythm without murmur, rub or gallop. ABDOMEN:  Soft, with marked hepatosplenomegaly.  Liver palpable to umbilicus.  Spleen palpable 3 finger breaths below costal margin.  Tender, but no guarding or rebound tenderness.  Active bowel sounds. SKIN:  No rashes, ulcers or lesions. EXTREMITIES: No edema, no skin discoloration or tenderness.  No palpable cords. LYMPH NODES: No palpable cervical, supraclavicular, axillary or inguinal adenopathy  NEUROLOGICAL:  Unremarkable. PSYCH:  Appropriate.  Infusion on 07/17/2014  Component Date Value Ref Range Status  . WBC 07/17/2014 12.9* 3.8 - 10.6 K/uL Final   A-LINE DRAW  . RBC 07/17/2014 3.42* 4.40 - 5.90 MIL/uL Final  . Hemoglobin 07/17/2014 9.8* 13.0 - 18.0 g/dL Final  . HCT 07/17/2014 30.1* 40.0 - 52.0 % Final  . MCV 07/17/2014 88.0  80.0 - 100.0 fL Final  . MCH 07/17/2014 28.5  26.0 - 34.0 pg Final  . MCHC 07/17/2014 32.4  32.0 - 36.0 g/dL Final  . RDW 07/17/2014 17.6* 11.5 - 14.5 % Final  . Platelets 07/17/2014 262  150 - 440 K/uL Final  . Neutrophils Relative % 07/17/2014 79   Final  . Neutro Abs 07/17/2014 10.2* 1.4 - 6.5 K/uL Final  . Lymphocytes Relative 07/17/2014 11   Final  . Lymphs Abs 07/17/2014 1.4  1.0 - 3.6 K/uL Final  . Monocytes Relative 07/17/2014 9   Final  . Monocytes Absolute 07/17/2014 1.2* 0.2 - 1.0 K/uL Final  .  Eosinophils Relative 07/17/2014 0   Final  . Eosinophils Absolute 07/17/2014 0.0  0 - 0.7 K/uL Final  . Basophils Relative 07/17/2014 1   Final  . Basophils Absolute 07/17/2014 0.1  0 - 0.1 K/uL Final  . Sodium 07/17/2014 128* 135 - 145 mmol/L Final  . Potassium 07/17/2014 4.5  3.5 - 5.1 mmol/L Final  . Chloride 07/17/2014 95* 101 - 111 mmol/L Final  . CO2 07/17/2014 24  22 - 32 mmol/L Final  . Glucose, Bld 07/17/2014 81  65 - 99 mg/dL Final  . BUN 07/17/2014 28* 6 - 20 mg/dL Final  . Creatinine, Ser 07/17/2014 0.96  0.61 - 1.24 mg/dL Final  . Calcium 07/17/2014 9.6  8.9 - 10.3 mg/dL Final  . Total Protein 07/17/2014 8.0  6.5 - 8.1 g/dL Final  . Albumin 07/17/2014 2.7* 3.5 - 5.0 g/dL Final  . AST 07/17/2014 306* 15 - 41 U/L Final  . ALT 07/17/2014 30  17 - 63 U/L Final  . Alkaline Phosphatase 07/17/2014 360* 38 - 126 U/L Final  . Total Bilirubin 07/17/2014 1.6* 0.3 - 1.2 mg/dL Final  . GFR calc non Af Amer 07/17/2014 >60  >60 mL/min Final  . GFR calc Af Amer 07/17/2014 >60  >60 mL/min Final   Comment: (NOTE) The eGFR has been calculated using the CKD  EPI equation. This calculation has not been validated in all clinical situations. eGFR's persistently <60 mL/min signify possible Chronic Kidney Disease.   . Anion gap 07/17/2014 9  5 - 15 Final  . CEA 07/17/2014 1.1  0.0 - 4.7 ng/mL Final   Comment: (NOTE)       Roche ECLIA methodology       Nonsmokers  <3.9                                     Smokers     <5.6 Performed At: [facility] [place], [place] [id number] Lindon Romp MD WU:[id number]   . Hepatitis B Surface Ag 07/17/2014 Negative  Negative Final   Comment: (NOTE) Performed At: [facility] [place], [place] [id number] Lindon Romp MD VH:[id number]   . Hep B Core Total Ab 07/17/2014 Negative  Negative Final   Comment: (NOTE) Performed At: [facility] [place], [place] [id number] Lindon Romp MD UV:[id number]   . HCV Ab 07/17/2014 0.5  0.0 - 0.9 s/co ratio Final   Comment: (NOTE)                                  Negative:     < 0.8                             Indeterminate: 0.8 - 0.9                                  Positive:     > 0.9 The CDC recommends that a positive HCV antibody result be followed up with a HCV Nucleic Acid Amplification test ([id number]). Performed At: [facility] [place], [place] [id number] Lindon Romp MD IR:[id number]   . Prothrombin Time 07/17/2014 16.6* 11.4 - 15.0 seconds Final  . INR  07/17/2014 1.32   Final    Assessment:  Mark Lynn is a 54 y.o. 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 He is status post 6 cycles of carboplatin and Taxol.  He is currently status post 2 cycles of the Nivolumab (Opdivo).  He has ongoing abdominal pain and increasing liver function tests. Exam reveals marked hepatomegaly.  Plan: 1. Discuss concern for progressive disease.  Discuss urgent abdominal and pelvic CT scan.  Discuss pain management.  Patient feels pain is  stable and controlled on current medications. 2. Hold chemotherapy. 3. Abdominal and pelvic CT scan STAT- metastatic lung cancer, increasing abdominal pain and distention; known liver mets 4. Additional labs:  CEA, Hepatitis B and C testing, PT/INR. 5. Nystatin swish and spit for thrush. 6. RTC after CT scan.   Lequita Asal, MD

## 2014-08-27 DEATH — deceased

## 2015-01-24 ENCOUNTER — Other Ambulatory Visit: Payer: Self-pay | Admitting: Nurse Practitioner

## 2015-07-03 IMAGING — CR DG CHEST 1V PORT
1 series · 1 of 1 positions shown · non-contrast
Comparison: 01/04/2014 and the CT of 12/19/2013.

CLINICAL DATA: Postop for port placement. Evaluate for pleural
effusion. Lung cancer. COPD. Smoker.

EXAM:
PORTABLE CHEST - 1 VIEW

[ap]
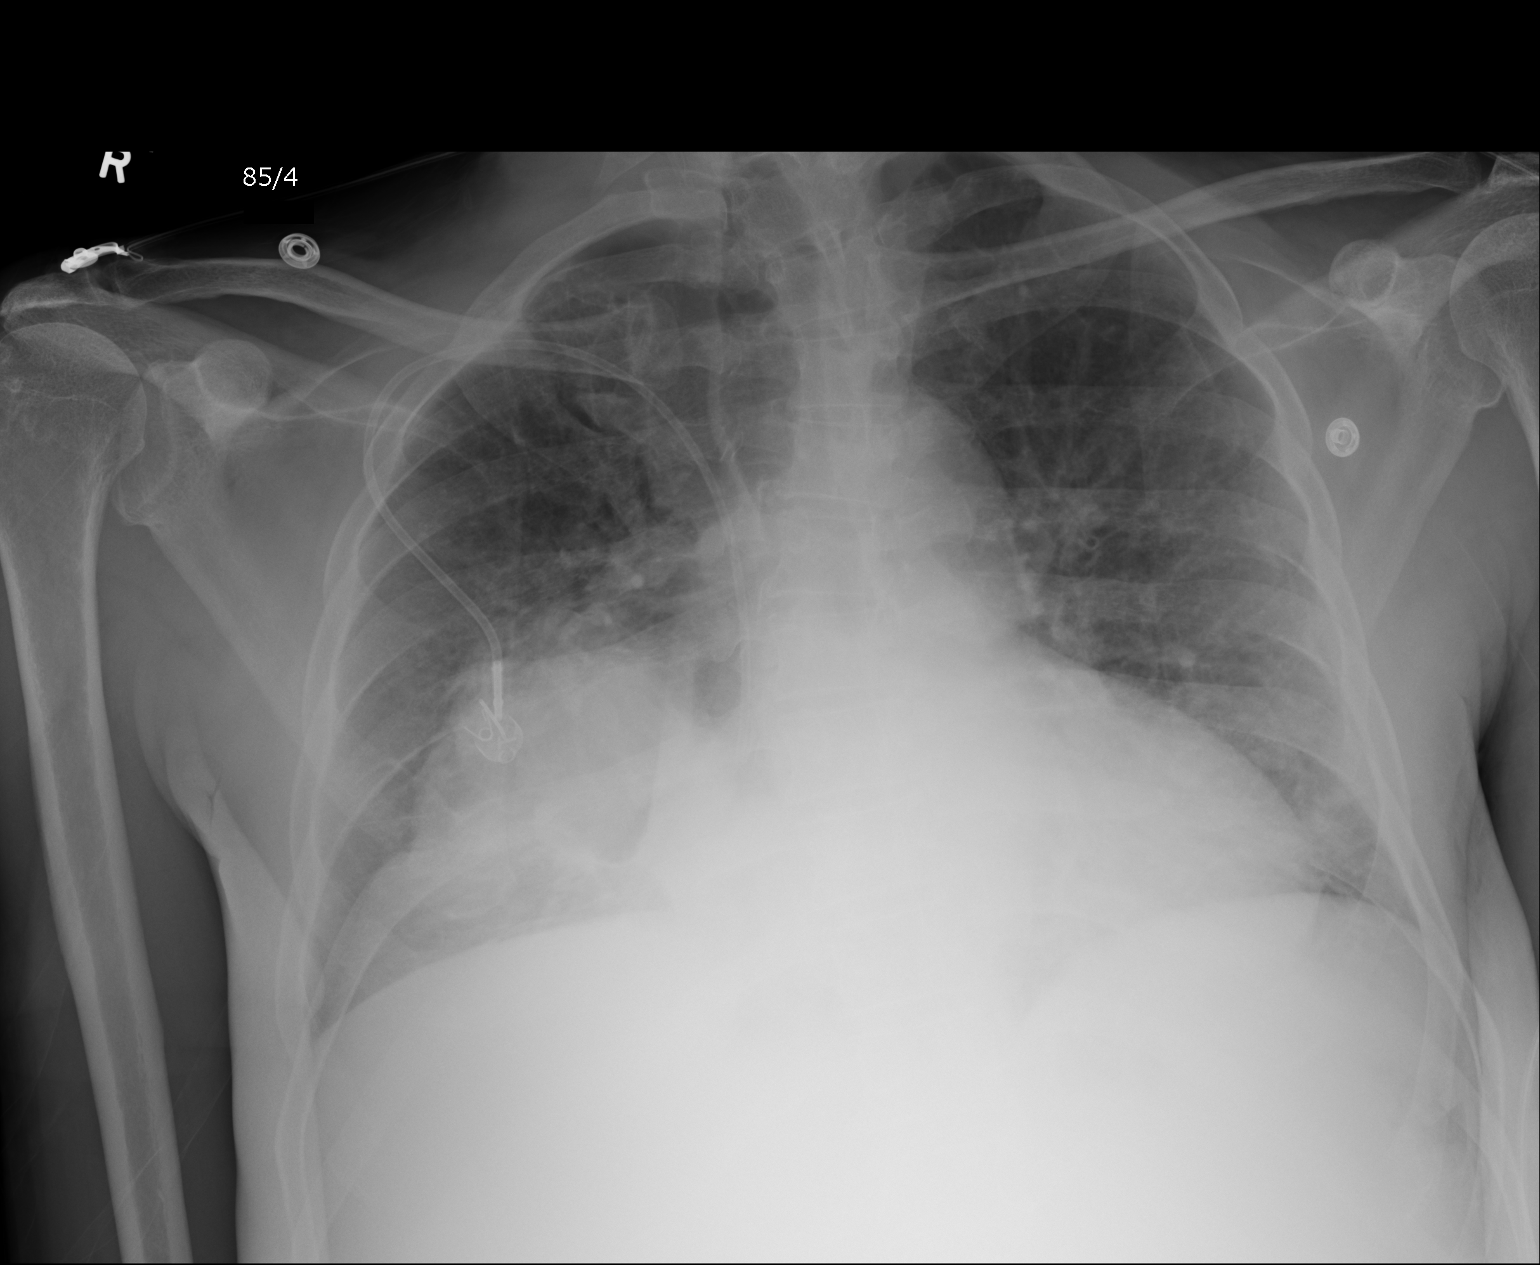

[1 of 1 positions shown; findings below may reference images not displayed]

FINDINGS: A right-sided Port-A-Cath terminates at the low SVC.

Patient rotated to the right. Mild cardiomegaly. No pneumothorax.
Lung volumes are diminished. Suspect mild pulmonary venous
congestion. Right lower lobe lung mass. No large pleural effusion.
Difficult to exclude small amount of layering right-sided pleural
fluid.
IMPRESSION: Placement a right-sided Port-A-Cath, without pneumothorax.

Diminished lung volumes.  Possible mild pulmonary venous congestion.

Right lower lobe lung mass, as before.

## 2015-09-20 IMAGING — CT CT CHEST-ABD-PELV W/ CM
2 of 5 series · 13 of 46 positions shown, 15 images · IV contrast (omnipaque)
Comparison: PET of 01/03/2014.  Diagnostic CTs of 12/19/2013.

CLINICAL DATA: Restaging of lung cancer. Chemotherapy every 3
weeks. Stage IV squamous cell carcinoma with liver metastasis.

EXAM:
CT CHEST, ABDOMEN, AND PELVIS WITH CONTRAST
TECHNIQUE: Multidetector CT imaging of the chest, abdomen and pelvis was
performed following the standard protocol during bolus
administration of intravenous contrast.
CONTRAST:  110 cc of Omnipaque 350

[Series 2: cap with · axial · 0.83mm/px · z∈[-1076,-476]mm · 10 of 137 slices shown, 12 images]
[im 9/137  soft-tissue]
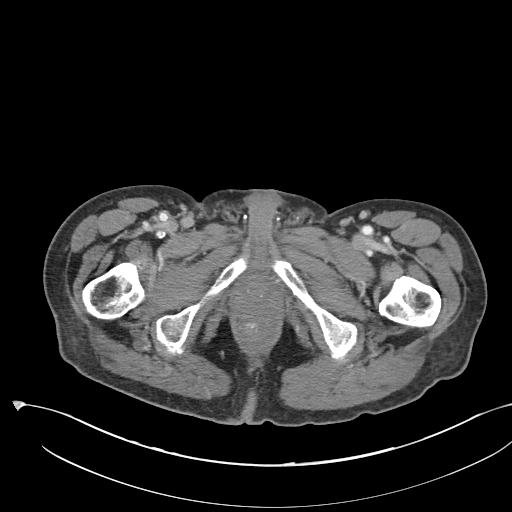
[im 9/137  bone]
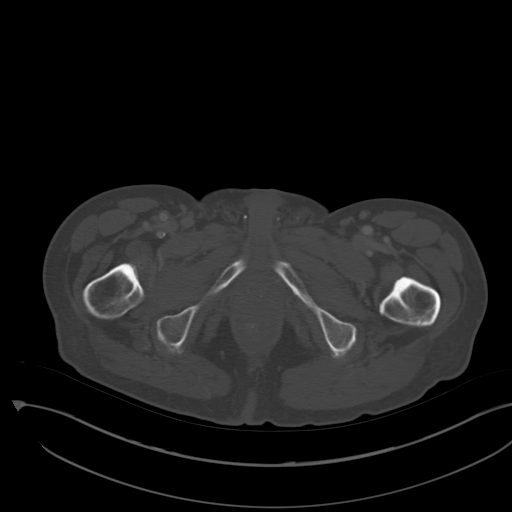
[im 25/137  soft-tissue]
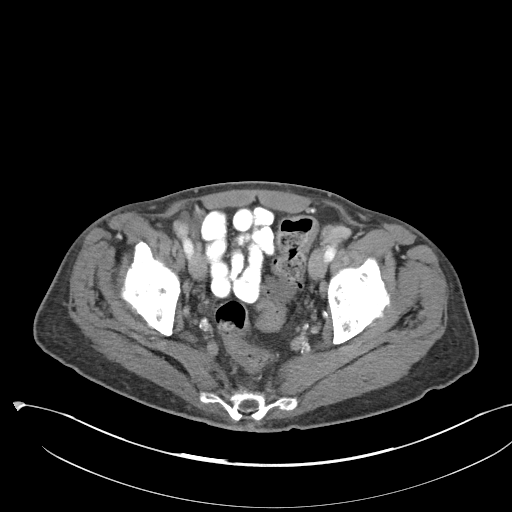
[im 41/137  soft-tissue]
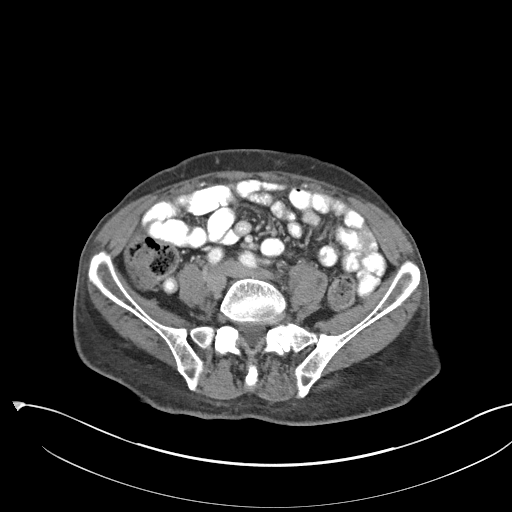
[im 49/137  soft-tissue]
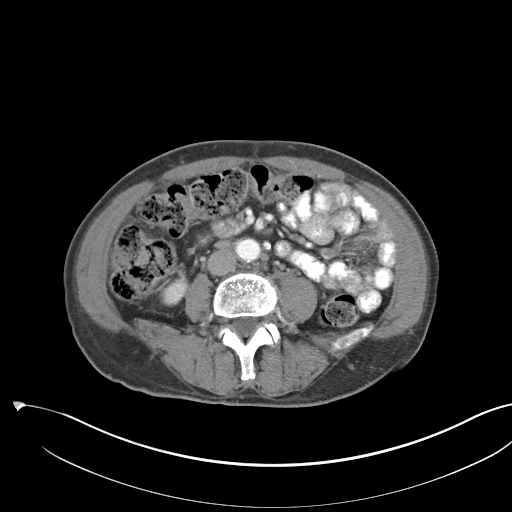
[im 65/137  soft-tissue]
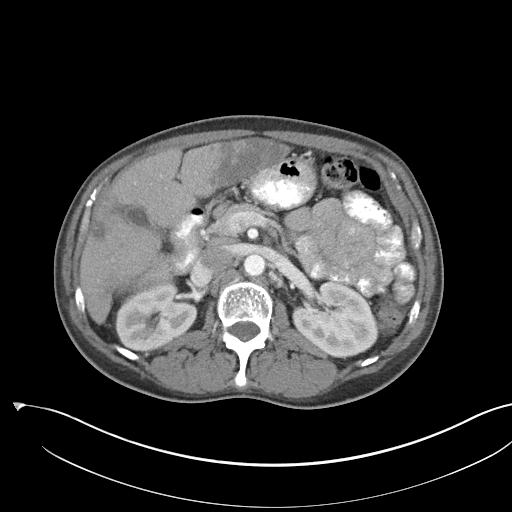
[im 73/137  soft-tissue]
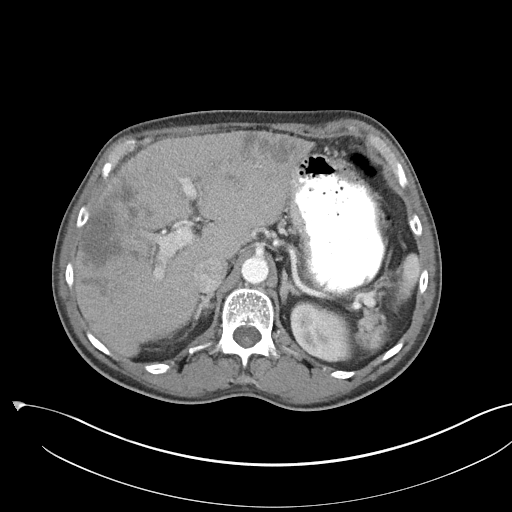
[im 89/137  soft-tissue]
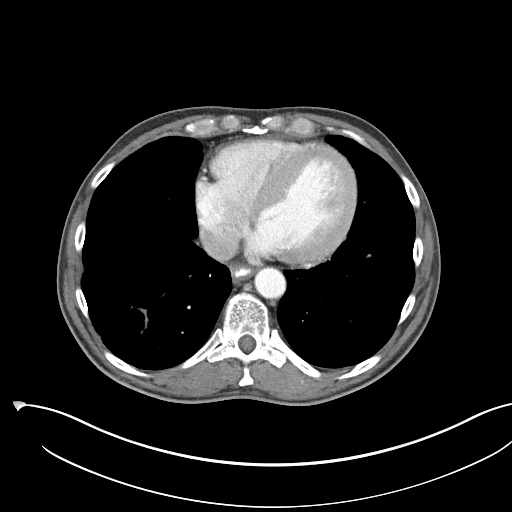
[im 105/137  soft-tissue]
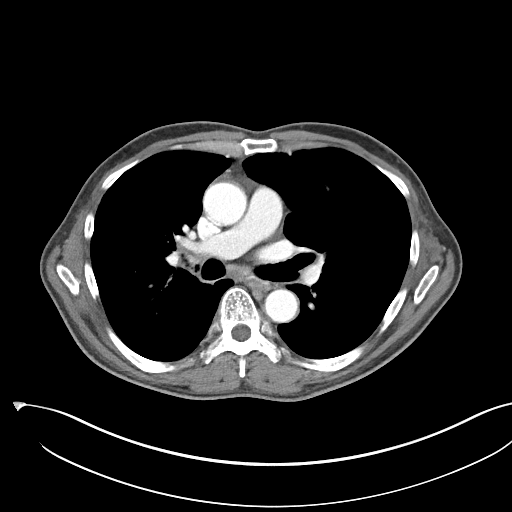
[im 113/137  soft-tissue]
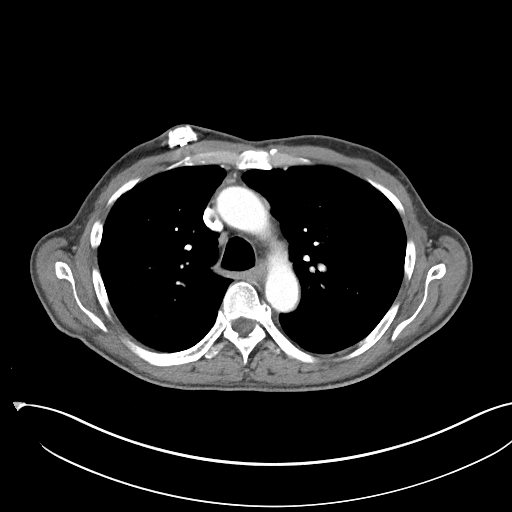
[im 113/137  bone]
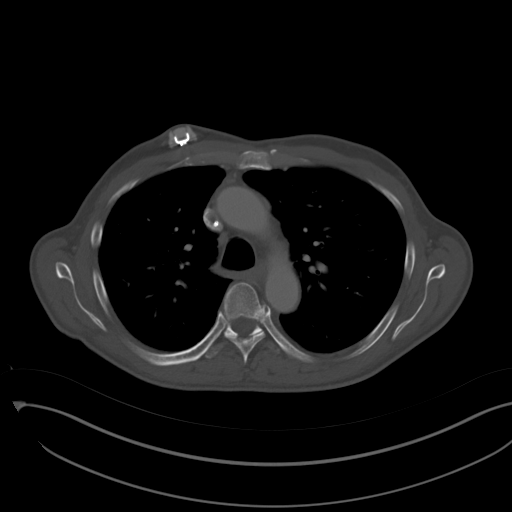
[im 129/137  soft-tissue]
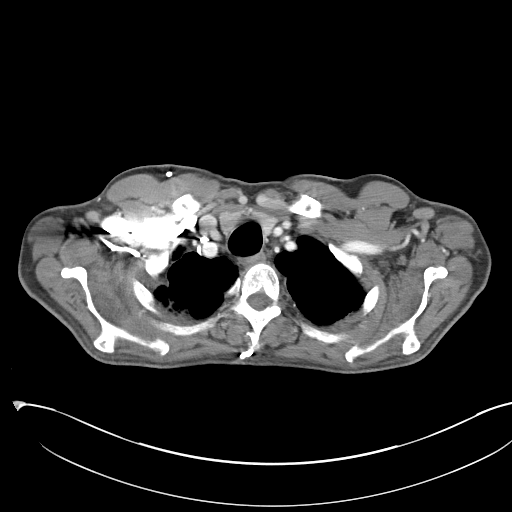

[Series 6: cor cap with · coronal · 0.77mm/px · 3 of 134 slices shown]
[im 45/134  soft-tissue]
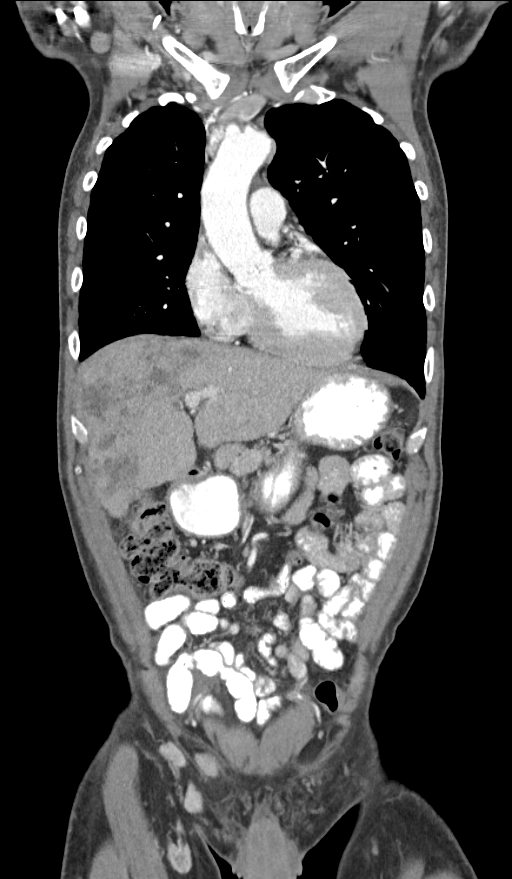
[im 60/134  soft-tissue]
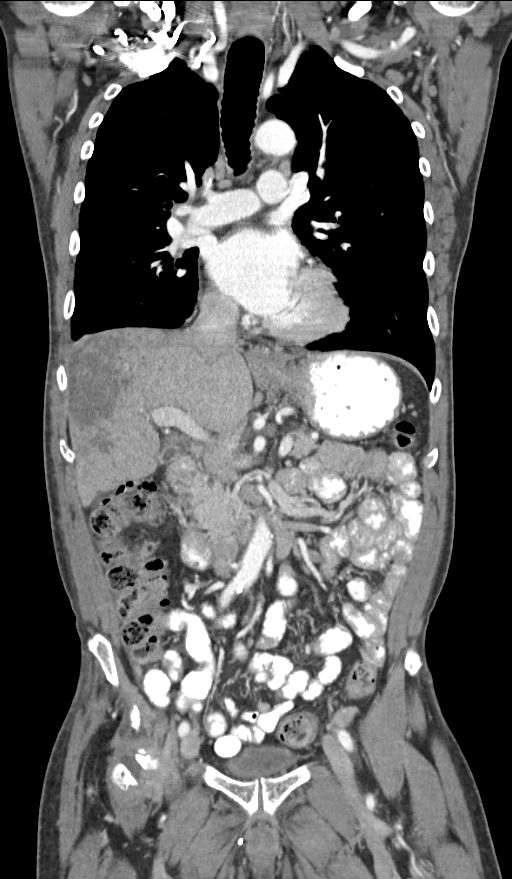
[im 74/134  soft-tissue]
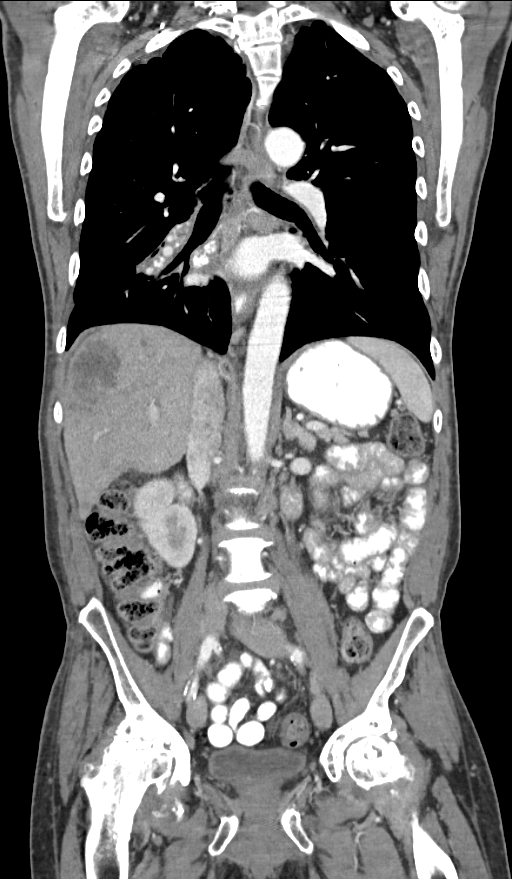

[13 of 46 positions shown; findings below may reference images not displayed]

FINDINGS: CT CHEST FINDINGS

Mediastinum/Nodes: small left supraclavicular/ low jugular nodes are
similar and favored to be reactive. Increased number of bilateral
axillary nodes. A right subpectoral node measures 1.0 cm on image 14
versus 8 mm on the prior.

Mild cardiomegaly, without pericardial effusion. No central
pulmonary embolism, on this non-dedicated study. Subcarinal node
measures 1.3 cm on image 34 versus 1.6 cm on the prior. Resolution
of right hilar adenopathy. Partially calcified right infrahilar
nodal tissue remains prominent but markedly decreased in size.
cm on image 40.

Lungs/Pleura: Trace right pleural fluid.  Centrilobular emphysema.

Markedly decreased right lower lobe pulmonary mass. 4.7 x 5.7 cm on
image 43 versus 10.0 x 11.0 cm on the prior exam (when remeasured).

Paraseptal emphysema at the apices.

Musculoskeletal: No acute osseous abnormality.

CT ABDOMEN PELVIS FINDINGS

Hepatobiliary: Improved hepatic metastasis. Lesion centered in the
anterior subcapsular right hepatic lobe measures 5.0 x 5.9 cm on
image 60 versus 11.7 x 9.5 cm at the same level on the prior.

Combination of multiple small lesions in the lateral segment left
liver lobe measure 7.5 x 4.5 cm on image 69. Compare 12.3 x 9.5 cm
on the prior exam (when remeasured). Gallbladder is contracted,
without acute inflammation or biliary ductal dilatation.

Pancreas: Normal, without mass or ductal dilatation.

Spleen: Normal

Adrenals/Urinary Tract: Normal adrenal glands. Normal kidneys,
without hydronephrosis. Normal urinary bladder.

Stomach/Bowel: Normal stomach, without wall thickening. Normal
colon, appendix, and terminal ileum. Normal small bowel.

Vascular/Lymphatic: Age advanced aortic and branch vessel
atherosclerosis. Small retroperitoneal nodes, nonpathologic by size
criteria. A porta hepatis node measures 11 mm on image 72 and is
unchanged. Indeterminate. Right external iliac node measures 1.2 cm
on image 125 versus 1.0 cm on the prior.

A left external iliac node is also newly enlarged at 1.2 cm.

Reproductive: Normal prostate.

Other: Trace perihepatic ascites, likely similar.

Musculoskeletal: Joint space narrowing and subchondral cyst
formation involve both hips. Age advanced. There is synovial
thickening in both hips, greater on the right. Disc bulge at L4-5.
IMPRESSION: CT CHEST IMPRESSION

1. Response to therapy of right lower lobe lung mass and thoracic
nodal metastasis.
2. Small axillary and left supraclavicular nodes. Some are slightly
increased in size. Although these could be reactive, especially
given the appearance of the pelvis, an atypical appearance of lung
cancer metastasis or a metachronous lymphoproliferative process
should be considered. Recommend attention on follow-up.

CT ABDOMEN AND PELVIS IMPRESSION

1. Response to therapy of hepatic metastasis.
2. Developing pelvic adenopathy. This also could be reactive or
represent an unusual appearance of lung cancer metastasis.
Metachronous lymphoproliferative process is a secondary
consideration.
3. Age advanced atherosclerosis.
4.  Possible constipation.
